# Patient Record
Sex: Female | Born: 1935 | Race: White | Hispanic: No | Marital: Single
Health system: Southern US, Community
[De-identification: ages and names within clinical notes are randomized; demographics above are authoritative.]

---

## 2006-03-04 ENCOUNTER — Ambulatory Visit: Payer: Self-pay | Admitting: Unknown Physician Specialty

## 2012-09-18 ENCOUNTER — Ambulatory Visit: Payer: Self-pay | Admitting: Nurse Practitioner

## 2012-09-25 ENCOUNTER — Inpatient Hospital Stay: Payer: Self-pay | Admitting: Orthopedic Surgery

## 2012-09-25 LAB — CBC
HCT: 41.7 % (ref 35.0–47.0)
HGB: 14 g/dL (ref 12.0–16.0)
MCH: 32 pg (ref 26.0–34.0)
MCV: 95 fL (ref 80–100)
Platelet: 242 10*3/uL (ref 150–440)
RDW: 12.4 % (ref 11.5–14.5)

## 2012-09-25 LAB — COMPREHENSIVE METABOLIC PANEL
Albumin: 3.5 g/dL (ref 3.4–5.0)
Alkaline Phosphatase: 91 U/L (ref 50–136)
Bilirubin,Total: 0.9 mg/dL (ref 0.2–1.0)
Calcium, Total: 9 mg/dL (ref 8.5–10.1)
Co2: 28 mmol/L (ref 21–32)
Creatinine: 0.84 mg/dL (ref 0.60–1.30)
EGFR (African American): >60
EGFR (Non-African Amer.): >60
Osmolality: 277 (ref 275–301)
Potassium: 3.6 mmol/L (ref 3.5–5.1)
SGOT(AST): 67 U/L — ABNORMAL HIGH (ref 15–37)
SGPT (ALT): 28 U/L (ref 12–78)
Sodium: 138 mmol/L (ref 136–145)

## 2012-09-25 LAB — CBC WITH DIFFERENTIAL/PLATELET
Basophil #: 0 10*3/uL (ref 0.0–0.1)
Basophil %: 0.3 %
Eosinophil #: 0 10*3/uL (ref 0.0–0.7)
Eosinophil %: 0.1 %
HGB: 13.4 g/dL (ref 12.0–16.0)
Lymphocyte #: 0.7 10*3/uL — ABNORMAL LOW (ref 1.0–3.6)
MCH: 33.2 pg (ref 26.0–34.0)
MCV: 96 fL (ref 80–100)
Monocyte #: 1.5 x10 3/mm — ABNORMAL HIGH (ref 0.2–0.9)
Neutrophil #: 14 10*3/uL — ABNORMAL HIGH (ref 1.4–6.5)
Neutrophil %: 86 %
RBC: 4.02 10*6/uL (ref 3.80–5.20)
RDW: 12.6 % (ref 11.5–14.5)
WBC: 16.3 10*3/uL — ABNORMAL HIGH (ref 3.6–11.0)

## 2012-09-25 LAB — URINALYSIS, COMPLETE
Bilirubin,UR: NEGATIVE
Glucose,UR: 50 mg/dL (ref 0–75)
Protein: 30
RBC,UR: 3 /HPF (ref 0–5)
Specific Gravity: 1.014 (ref 1.003–1.030)
WBC UR: 1 /HPF (ref 0–5)

## 2012-09-25 LAB — ETHANOL
Ethanol %: <0.003 % (ref 0.000–0.080)
Ethanol: <3 mg/dL

## 2012-09-25 LAB — PROTIME-INR: INR: 1

## 2012-09-25 LAB — CK-MB: CK-MB: 50.7 ng/mL — ABNORMAL HIGH (ref 0.5–3.6)

## 2012-09-25 LAB — APTT: Activated PTT: 27.5 secs (ref 23.6–35.9)

## 2012-09-25 LAB — CK TOTAL AND CKMB (NOT AT ARMC): CK-MB: 25.1 ng/mL — ABNORMAL HIGH (ref 0.5–3.6)

## 2012-09-26 ENCOUNTER — Ambulatory Visit: Payer: Self-pay | Admitting: Orthopedic Surgery

## 2012-09-26 DIAGNOSIS — Z01810 Encounter for preprocedural cardiovascular examination: Secondary | ICD-10-CM

## 2012-09-26 LAB — APTT
Activated PTT: 71.2 secs — ABNORMAL HIGH (ref 23.6–35.9)
Activated PTT: 23 secs — ABNORMAL LOW (ref 23.6–35.9)

## 2012-09-26 LAB — TROPONIN I: Troponin-I: 1.3 ng/mL — ABNORMAL HIGH

## 2012-09-27 DIAGNOSIS — Z01810 Encounter for preprocedural cardiovascular examination: Secondary | ICD-10-CM

## 2012-09-27 LAB — BASIC METABOLIC PANEL WITH GFR
Anion Gap: 4 — ABNORMAL LOW (ref 7–16)
BUN: 16 mg/dL (ref 7–18)
Calcium, Total: 7.8 mg/dL — ABNORMAL LOW (ref 8.5–10.1)
Chloride: 109 mmol/L — ABNORMAL HIGH (ref 98–107)
Co2: 26 mmol/L (ref 21–32)
Creatinine: 0.75 mg/dL (ref 0.60–1.30)
EGFR (African American): >60
EGFR (Non-African Amer.): >60
Glucose: 113 mg/dL — ABNORMAL HIGH (ref 65–99)
Osmolality: 280 (ref 275–301)
Potassium: 3.9 mmol/L (ref 3.5–5.1)
Sodium: 139 mmol/L (ref 136–145)

## 2012-09-27 LAB — CBC WITH DIFFERENTIAL/PLATELET
Basophil %: 0.3 %
Eosinophil #: 0.1 10*3/uL (ref 0.0–0.7)
Eosinophil %: 0.8 %
HCT: 32.3 % — ABNORMAL LOW (ref 35.0–47.0)
Lymphocyte #: 1.3 10*3/uL (ref 1.0–3.6)
MCH: 32.3 pg (ref 26.0–34.0)
MCHC: 33.5 g/dL (ref 32.0–36.0)
MCV: 97 fL (ref 80–100)
Neutrophil #: 8.8 10*3/uL — ABNORMAL HIGH (ref 1.4–6.5)
Platelet: 190 10*3/uL (ref 150–440)
RBC: 3.34 10*6/uL — ABNORMAL LOW (ref 3.80–5.20)
RDW: 12.4 % (ref 11.5–14.5)
WBC: 11.6 10*3/uL — ABNORMAL HIGH (ref 3.6–11.0)

## 2012-09-27 LAB — TROPONIN I: Troponin-I: 0.47 ng/mL — ABNORMAL HIGH

## 2012-09-28 LAB — HEMOGLOBIN: HGB: 9.7 g/dL — ABNORMAL LOW (ref 12.0–16.0)

## 2012-09-29 ENCOUNTER — Encounter: Payer: Self-pay | Admitting: Internal Medicine

## 2012-09-29 LAB — CK: CK, Total: 604 U/L — ABNORMAL HIGH (ref 21–215)

## 2012-09-29 LAB — HEMOGLOBIN: HGB: 9.6 g/dL — ABNORMAL LOW (ref 12.0–16.0)

## 2012-10-01 LAB — CREATININE, SERUM
Creatinine: 0.57 mg/dL — ABNORMAL LOW (ref 0.60–1.30)
EGFR (African American): >60
EGFR (Non-African Amer.): >60

## 2012-10-01 LAB — PATHOLOGY REPORT

## 2012-10-02 LAB — HEMOGLOBIN: HGB: 9.8 g/dL — ABNORMAL LOW (ref 12.0–16.0)

## 2012-10-19 ENCOUNTER — Encounter: Payer: Self-pay | Admitting: Internal Medicine

## 2012-10-19 ENCOUNTER — Ambulatory Visit: Payer: Self-pay | Admitting: Nurse Practitioner

## 2012-11-04 ENCOUNTER — Emergency Department: Payer: Self-pay | Admitting: Emergency Medicine

## 2012-11-05 ENCOUNTER — Emergency Department: Payer: Self-pay | Admitting: Emergency Medicine

## 2012-11-14 ENCOUNTER — Emergency Department: Payer: Self-pay | Admitting: Emergency Medicine

## 2012-11-14 LAB — COMPREHENSIVE METABOLIC PANEL
BUN: 13 mg/dL (ref 7–18)
Calcium, Total: 8.7 mg/dL (ref 8.5–10.1)
Co2: 28 mmol/L (ref 21–32)
EGFR (African American): >60
EGFR (Non-African Amer.): >60
Glucose: 108 mg/dL — ABNORMAL HIGH (ref 65–99)
Osmolality: 280 (ref 275–301)
Potassium: 4.1 mmol/L (ref 3.5–5.1)
SGOT(AST): 19 U/L (ref 15–37)
Total Protein: 6.6 g/dL (ref 6.4–8.2)

## 2012-11-14 LAB — CBC
HCT: 35.4 % (ref 35.0–47.0)
HGB: 11.7 g/dL — ABNORMAL LOW (ref 12.0–16.0)
MCH: 31.3 pg (ref 26.0–34.0)
MCV: 95 fL (ref 80–100)
Platelet: 244 10*3/uL (ref 150–440)
RBC: 3.73 10*6/uL — ABNORMAL LOW (ref 3.80–5.20)
RDW: 13 % (ref 11.5–14.5)

## 2012-11-14 LAB — TROPONIN I: Troponin-I: <0.02 ng/mL

## 2012-11-18 ENCOUNTER — Ambulatory Visit: Payer: Self-pay | Admitting: Nurse Practitioner

## 2012-11-22 ENCOUNTER — Emergency Department: Payer: Self-pay | Admitting: Emergency Medicine

## 2012-12-02 ENCOUNTER — Emergency Department: Payer: Self-pay | Admitting: Emergency Medicine

## 2012-12-04 ENCOUNTER — Inpatient Hospital Stay: Payer: Self-pay | Admitting: Internal Medicine

## 2012-12-04 LAB — URINALYSIS, COMPLETE
Bacteria: NONE SEEN
Blood: NEGATIVE
Nitrite: NEGATIVE
Ph: 6 (ref 4.5–8.0)
RBC,UR: 2 /HPF (ref 0–5)
Specific Gravity: 1.016 (ref 1.003–1.030)
Squamous Epithelial: NONE SEEN
WBC UR: <1 /HPF (ref 0–5)

## 2012-12-04 LAB — CBC
HCT: 34 % — ABNORMAL LOW (ref 35.0–47.0)
HGB: 11.6 g/dL — ABNORMAL LOW (ref 12.0–16.0)
MCHC: 34.3 g/dL (ref 32.0–36.0)
Platelet: 305 10*3/uL (ref 150–440)
RBC: 3.72 10*6/uL — ABNORMAL LOW (ref 3.80–5.20)
RDW: 13.3 % (ref 11.5–14.5)
WBC: 15.6 10*3/uL — ABNORMAL HIGH (ref 3.6–11.0)

## 2012-12-04 LAB — COMPREHENSIVE METABOLIC PANEL
Albumin: 2.6 g/dL — ABNORMAL LOW (ref 3.4–5.0)
BUN: 13 mg/dL (ref 7–18)
Bilirubin,Total: 0.6 mg/dL (ref 0.2–1.0)
Chloride: 100 mmol/L (ref 98–107)
Co2: 25 mmol/L (ref 21–32)
Creatinine: 0.84 mg/dL (ref 0.60–1.30)
EGFR (African American): >60
EGFR (Non-African Amer.): >60
Osmolality: 269 (ref 275–301)
SGOT(AST): 19 U/L (ref 15–37)

## 2012-12-05 LAB — CBC WITH DIFFERENTIAL/PLATELET
Basophil #: 0 10*3/uL (ref 0.0–0.1)
Basophil %: 0.2 %
Eosinophil #: 0 10*3/uL (ref 0.0–0.7)
HCT: 29 % — ABNORMAL LOW (ref 35.0–47.0)
HGB: 10.1 g/dL — ABNORMAL LOW (ref 12.0–16.0)
Lymphocyte %: 9.4 %
MCH: 31.5 pg (ref 26.0–34.0)
MCHC: 34.8 g/dL (ref 32.0–36.0)
MCV: 91 fL (ref 80–100)
Monocyte #: 1.2 x10 3/mm — ABNORMAL HIGH (ref 0.2–0.9)
Monocyte %: 11.7 %
Neutrophil #: 8.3 10*3/uL — ABNORMAL HIGH (ref 1.4–6.5)
Platelet: 253 10*3/uL (ref 150–440)
WBC: 10.5 10*3/uL (ref 3.6–11.0)

## 2012-12-05 LAB — BASIC METABOLIC PANEL
Calcium, Total: 8.5 mg/dL (ref 8.5–10.1)
Chloride: 102 mmol/L (ref 98–107)
Co2: 22 mmol/L (ref 21–32)
EGFR (African American): >60
Glucose: 95 mg/dL (ref 65–99)
Potassium: 3.6 mmol/L (ref 3.5–5.1)
Sodium: 132 mmol/L — ABNORMAL LOW (ref 136–145)

## 2012-12-05 LAB — MAGNESIUM: Magnesium: 1.7 mg/dL — ABNORMAL LOW

## 2012-12-06 LAB — BASIC METABOLIC PANEL
Anion Gap: 10 (ref 7–16)
BUN: 7 mg/dL (ref 7–18)
Calcium, Total: 8.5 mg/dL (ref 8.5–10.1)
Co2: 21 mmol/L (ref 21–32)
Creatinine: 0.5 mg/dL — ABNORMAL LOW (ref 0.60–1.30)
EGFR (African American): >60
Glucose: 101 mg/dL — ABNORMAL HIGH (ref 65–99)
Osmolality: 264 (ref 275–301)
Potassium: 3.5 mmol/L (ref 3.5–5.1)

## 2012-12-10 LAB — CULTURE, BLOOD (SINGLE)

## 2012-12-31 ENCOUNTER — Inpatient Hospital Stay: Payer: Self-pay | Admitting: Internal Medicine

## 2012-12-31 LAB — BASIC METABOLIC PANEL
Anion Gap: 8 (ref 7–16)
Chloride: 108 mmol/L — ABNORMAL HIGH (ref 98–107)
Creatinine: 0.87 mg/dL (ref 0.60–1.30)
EGFR (African American): >60
Glucose: 113 mg/dL — ABNORMAL HIGH (ref 65–99)
Osmolality: 287 (ref 275–301)
Potassium: 3.8 mmol/L (ref 3.5–5.1)

## 2012-12-31 LAB — CBC WITH DIFFERENTIAL/PLATELET
Basophil #: 0 10*3/uL (ref 0.0–0.1)
Basophil %: 0.3 %
Eosinophil #: 0.2 10*3/uL (ref 0.0–0.7)
Eosinophil %: 2.4 %
HCT: 32.7 % — ABNORMAL LOW (ref 35.0–47.0)
Lymphocyte #: 1.6 10*3/uL (ref 1.0–3.6)
MCH: 29.5 pg (ref 26.0–34.0)
Monocyte #: 0.8 x10 3/mm (ref 0.2–0.9)
Neutrophil #: 5.3 10*3/uL (ref 1.4–6.5)
Neutrophil %: 66.9 %
RBC: 3.67 10*6/uL — ABNORMAL LOW (ref 3.80–5.20)
WBC: 7.9 10*3/uL (ref 3.6–11.0)

## 2013-01-01 DIAGNOSIS — I359 Nonrheumatic aortic valve disorder, unspecified: Secondary | ICD-10-CM

## 2013-01-01 LAB — CBC WITH DIFFERENTIAL/PLATELET
Basophil #: 0.1 10*3/uL (ref 0.0–0.1)
Basophil %: 0.6 %
Eosinophil #: 0.1 10*3/uL (ref 0.0–0.7)
Eosinophil %: 0.8 %
HGB: 11.3 g/dL — ABNORMAL LOW (ref 12.0–16.0)
Lymphocyte #: 1.4 10*3/uL (ref 1.0–3.6)
MCH: 29.6 pg (ref 26.0–34.0)
MCHC: 33 g/dL (ref 32.0–36.0)
Monocyte #: 1.2 x10 3/mm — ABNORMAL HIGH (ref 0.2–0.9)
Neutrophil %: 78 %
Platelet: 299 10*3/uL (ref 150–440)
WBC: 12.7 10*3/uL — ABNORMAL HIGH (ref 3.6–11.0)

## 2013-01-01 LAB — CK TOTAL AND CKMB (NOT AT ARMC)
CK, Total: 47 U/L (ref 21–215)
CK-MB: 1.8 ng/mL (ref 0.5–3.6)
CK-MB: 1.6 ng/mL (ref 0.5–3.6)
CK-MB: 1.3 ng/mL (ref 0.5–3.6)

## 2013-01-01 LAB — URINALYSIS, COMPLETE
Bilirubin,UR: NEGATIVE
Blood: NEGATIVE
Squamous Epithelial: 1
WBC UR: 1 /HPF (ref 0–5)

## 2013-01-01 LAB — BASIC METABOLIC PANEL
BUN: 17 mg/dL (ref 7–18)
Chloride: 107 mmol/L (ref 98–107)
Co2: 26 mmol/L (ref 21–32)
Creatinine: 1.02 mg/dL (ref 0.60–1.30)
EGFR (African American): 58 — ABNORMAL LOW
EGFR (Non-African Amer.): 50 — ABNORMAL LOW
Osmolality: 287 (ref 275–301)
Sodium: 142 mmol/L (ref 136–145)

## 2013-01-01 LAB — TROPONIN I: Troponin-I: <0.02 ng/mL

## 2013-01-03 LAB — CREATININE, SERUM
Creatinine: 1.08 mg/dL (ref 0.60–1.30)
EGFR (African American): 54 — ABNORMAL LOW
EGFR (Non-African Amer.): 47 — ABNORMAL LOW

## 2013-01-03 LAB — VANCOMYCIN, TROUGH: Vancomycin, Trough: 12 ug/mL (ref 10–20)

## 2013-01-05 LAB — BASIC METABOLIC PANEL
Anion Gap: 8 (ref 7–16)
BUN: 13 mg/dL (ref 7–18)
Calcium, Total: 8.6 mg/dL (ref 8.5–10.1)
Chloride: 106 mmol/L (ref 98–107)
Co2: 25 mmol/L (ref 21–32)
Creatinine: 0.96 mg/dL (ref 0.60–1.30)
Glucose: 94 mg/dL (ref 65–99)
Osmolality: 277 (ref 275–301)
Potassium: 2.7 mmol/L — ABNORMAL LOW (ref 3.5–5.1)
Sodium: 139 mmol/L (ref 136–145)

## 2013-01-05 LAB — CBC WITH DIFFERENTIAL/PLATELET
Basophil #: 0.1 10*3/uL (ref 0.0–0.1)
Eosinophil #: 0.2 10*3/uL (ref 0.0–0.7)
Eosinophil %: 3.3 %
HCT: 31.6 % — ABNORMAL LOW (ref 35.0–47.0)
HGB: 10.7 g/dL — ABNORMAL LOW (ref 12.0–16.0)
Lymphocyte %: 17.9 %
MCH: 30.1 pg (ref 26.0–34.0)
MCHC: 33.8 g/dL (ref 32.0–36.0)
Monocyte #: 0.9 x10 3/mm (ref 0.2–0.9)
Neutrophil #: 4.9 10*3/uL (ref 1.4–6.5)
Neutrophil %: 66.3 %
RDW: 14.5 % (ref 11.5–14.5)
WBC: 7.5 10*3/uL (ref 3.6–11.0)

## 2013-01-05 LAB — MAGNESIUM: Magnesium: 1.9 mg/dL

## 2013-01-06 LAB — CULTURE, BLOOD (SINGLE)

## 2013-01-23 ENCOUNTER — Emergency Department: Payer: Self-pay | Admitting: Emergency Medicine

## 2013-03-25 ENCOUNTER — Emergency Department: Payer: Self-pay | Admitting: Emergency Medicine

## 2013-07-21 ENCOUNTER — Ambulatory Visit: Payer: Self-pay | Admitting: Nurse Practitioner

## 2013-07-31 ENCOUNTER — Inpatient Hospital Stay: Payer: Self-pay | Admitting: Internal Medicine

## 2013-07-31 LAB — CBC WITH DIFFERENTIAL/PLATELET
BASOS PCT: 0.2 %
Basophil #: 0 10*3/uL (ref 0.0–0.1)
EOS ABS: 0.1 10*3/uL (ref 0.0–0.7)
EOS PCT: 1 %
HCT: 40 % (ref 35.0–47.0)
HGB: 13.7 g/dL (ref 12.0–16.0)
LYMPHS ABS: 0.2 10*3/uL — AB (ref 1.0–3.6)
Lymphocyte %: 1.3 %
MCH: 31.2 pg (ref 26.0–34.0)
MCHC: 34.1 g/dL (ref 32.0–36.0)
MCV: 91 fL (ref 80–100)
Monocyte #: 0.6 x10 3/mm (ref 0.2–0.9)
Monocyte %: 4.5 %
NEUTROS PCT: 93 %
Neutrophil #: 13.5 10*3/uL — ABNORMAL HIGH (ref 1.4–6.5)
Platelet: 204 10*3/uL (ref 150–440)
RBC: 4.38 10*6/uL (ref 3.80–5.20)
RDW: 13 % (ref 11.5–14.5)
WBC: 14.5 10*3/uL — AB (ref 3.6–11.0)

## 2013-07-31 LAB — COMPREHENSIVE METABOLIC PANEL
ALK PHOS: 136 U/L — AB
ALT: 19 U/L (ref 12–78)
AST: 14 U/L — AB (ref 15–37)
Albumin: 3.2 g/dL — ABNORMAL LOW (ref 3.4–5.0)
Anion Gap: 6 — ABNORMAL LOW (ref 7–16)
BUN: 17 mg/dL (ref 7–18)
Bilirubin,Total: 0.5 mg/dL (ref 0.2–1.0)
CHLORIDE: 109 mmol/L — AB (ref 98–107)
CREATININE: 0.84 mg/dL (ref 0.60–1.30)
Calcium, Total: 9.2 mg/dL (ref 8.5–10.1)
Co2: 25 mmol/L (ref 21–32)
GLUCOSE: 152 mg/dL — AB (ref 65–99)
Osmolality: 284 (ref 275–301)
POTASSIUM: 4.2 mmol/L (ref 3.5–5.1)
SODIUM: 140 mmol/L (ref 136–145)
Total Protein: 7.6 g/dL (ref 6.4–8.2)

## 2013-07-31 LAB — URINALYSIS, COMPLETE
Bilirubin,UR: NEGATIVE
GLUCOSE, UR: NEGATIVE mg/dL (ref 0–75)
Ketone: NEGATIVE
Leukocyte Esterase: NEGATIVE
NITRITE: NEGATIVE
Ph: 5 (ref 4.5–8.0)
Protein: NEGATIVE
SPECIFIC GRAVITY: 1.018 (ref 1.003–1.030)
Squamous Epithelial: 1

## 2013-07-31 LAB — TROPONIN I: Troponin-I: <0.02 ng/mL

## 2013-07-31 LAB — LIPASE, BLOOD: Lipase: 127 U/L (ref 73–393)

## 2013-07-31 LAB — CLOSTRIDIUM DIFFICILE(ARMC)

## 2013-08-01 LAB — CBC WITH DIFFERENTIAL/PLATELET
Basophil #: 0 10*3/uL (ref 0.0–0.1)
Basophil %: 0.3 %
EOS PCT: 2 %
Eosinophil #: 0.1 10*3/uL (ref 0.0–0.7)
HCT: 32.1 % — ABNORMAL LOW (ref 35.0–47.0)
HGB: 11.1 g/dL — ABNORMAL LOW (ref 12.0–16.0)
LYMPHS ABS: 0.7 10*3/uL — AB (ref 1.0–3.6)
Lymphocyte %: 11.1 %
MCH: 32 pg (ref 26.0–34.0)
MCHC: 34.7 g/dL (ref 32.0–36.0)
MCV: 92 fL (ref 80–100)
MONO ABS: 0.8 x10 3/mm (ref 0.2–0.9)
Monocyte %: 12.4 %
Neutrophil #: 4.7 10*3/uL (ref 1.4–6.5)
Neutrophil %: 74.2 %
Platelet: 172 10*3/uL (ref 150–440)
RBC: 3.48 10*6/uL — ABNORMAL LOW (ref 3.80–5.20)
RDW: 13.1 % (ref 11.5–14.5)
WBC: 6.3 10*3/uL (ref 3.6–11.0)

## 2013-08-01 LAB — BASIC METABOLIC PANEL
Anion Gap: 7 (ref 7–16)
BUN: 14 mg/dL (ref 7–18)
CO2: 24 mmol/L (ref 21–32)
Calcium, Total: 8.4 mg/dL — ABNORMAL LOW (ref 8.5–10.1)
Chloride: 109 mmol/L — ABNORMAL HIGH (ref 98–107)
Creatinine: 0.76 mg/dL (ref 0.60–1.30)
EGFR (African American): >60
EGFR (Non-African Amer.): >60
GLUCOSE: 96 mg/dL (ref 65–99)
OSMOLALITY: 280 (ref 275–301)
Potassium: 3.4 mmol/L — ABNORMAL LOW (ref 3.5–5.1)
SODIUM: 140 mmol/L (ref 136–145)

## 2013-08-01 LAB — MAGNESIUM: Magnesium: 1.6 mg/dL — ABNORMAL LOW

## 2013-08-02 LAB — CANCER ANTIGEN 19-9: CA 19-9: 10 U/mL (ref 0–35)

## 2013-08-21 ENCOUNTER — Ambulatory Visit: Payer: Self-pay | Admitting: Nurse Practitioner

## 2013-09-18 ENCOUNTER — Ambulatory Visit: Payer: Self-pay | Admitting: Nurse Practitioner

## 2013-10-19 ENCOUNTER — Ambulatory Visit: Payer: Self-pay | Admitting: Nurse Practitioner

## 2013-11-18 ENCOUNTER — Ambulatory Visit: Payer: Self-pay | Admitting: Nurse Practitioner

## 2014-02-14 IMAGING — CT CT HEAD WITHOUT CONTRAST
1 series · 15 of 28 positions shown, 19 images · non-contrast
Comparison: none

REASON FOR EXAM: FALL, SCALP LAC
COMMENTS:   May transport without cardiac monitor

[Series 2: soft tissue · axial · 0.41mm/px · z∈[-153,-28]mm · 15 of 28 slices shown, 19 images]
[im 2/28  brain]
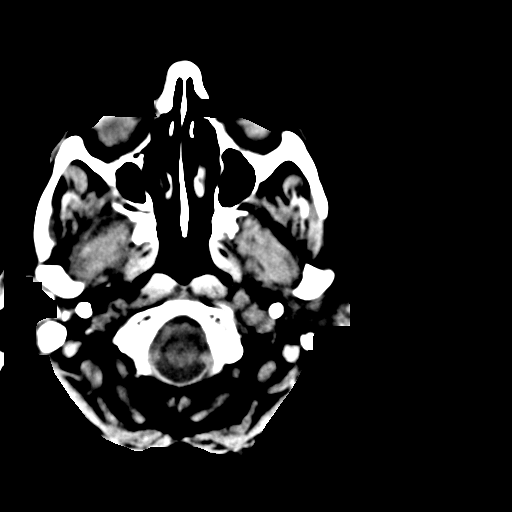
[im 2/28  bone]
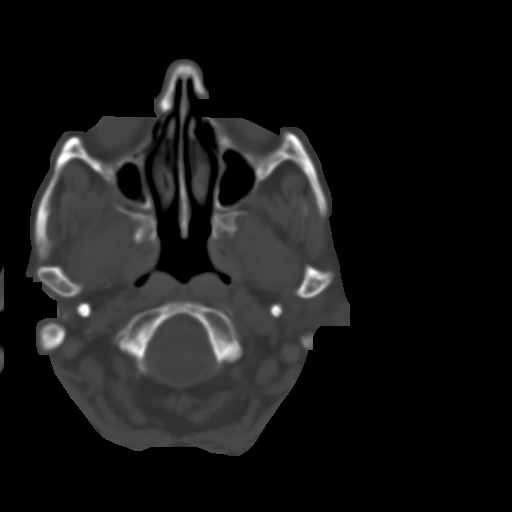
[im 4/28  brain]
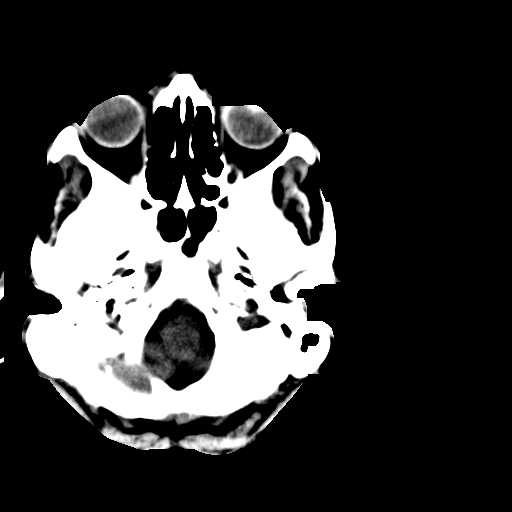
[im 6/28  brain]
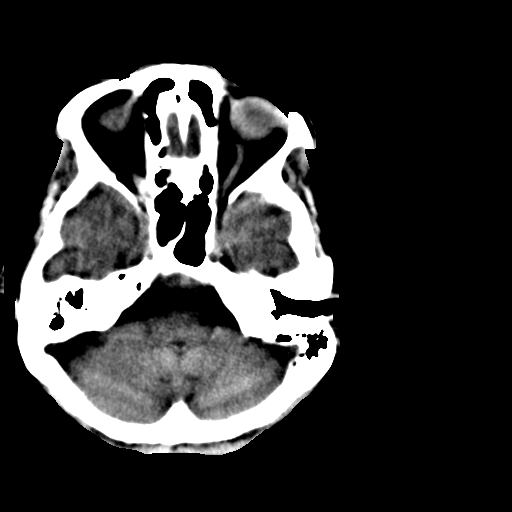
[im 8/28  brain]
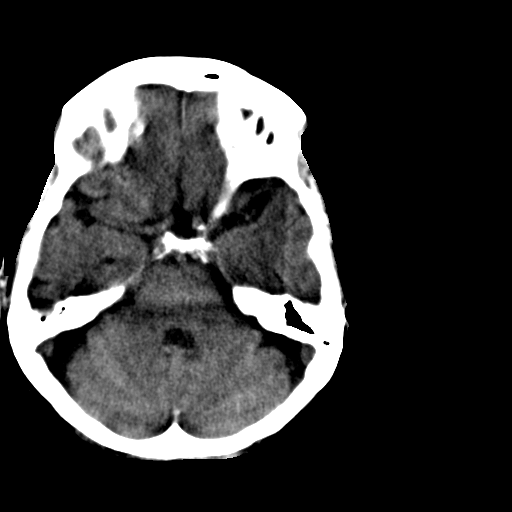
[im 9/28  brain]
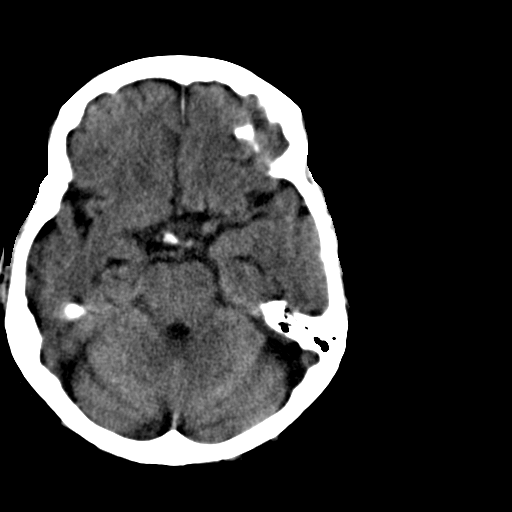
[im 9/28  bone]
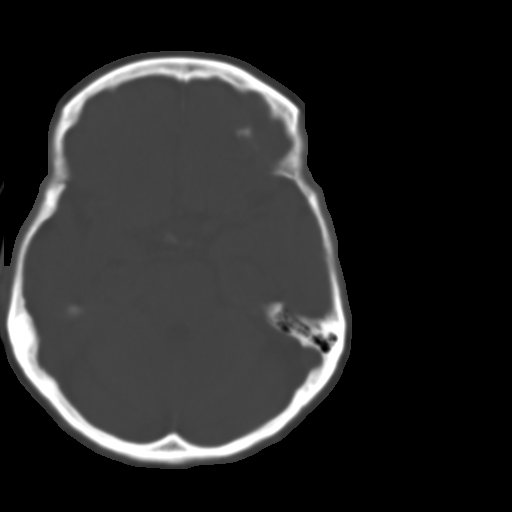
[im 11/28  brain]
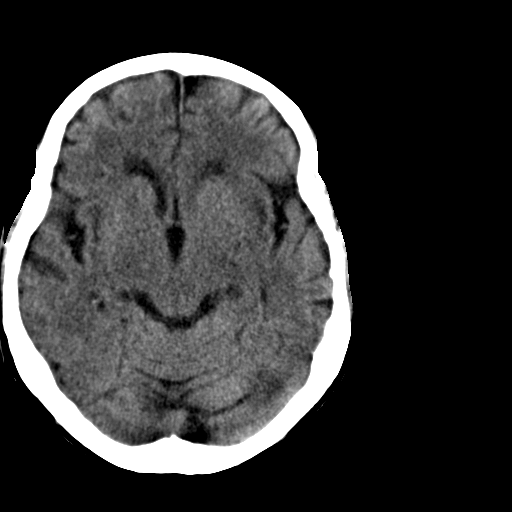
[im 13/28  brain]
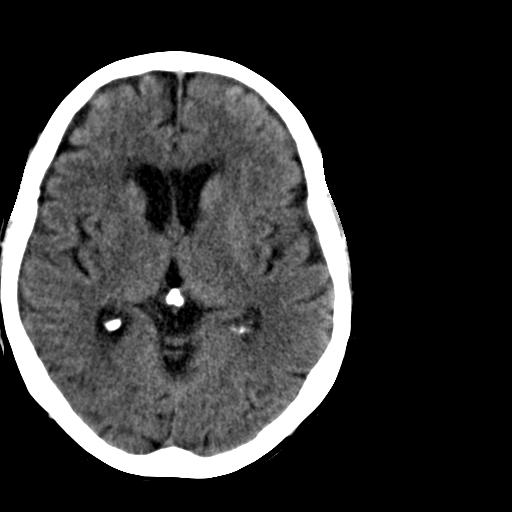
[im 15/28  brain]
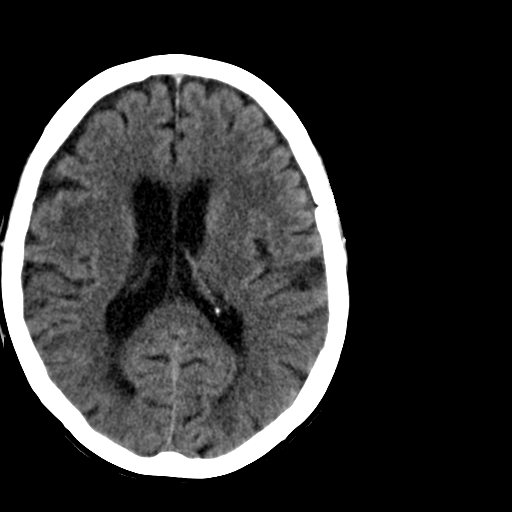
[im 16/28  brain]
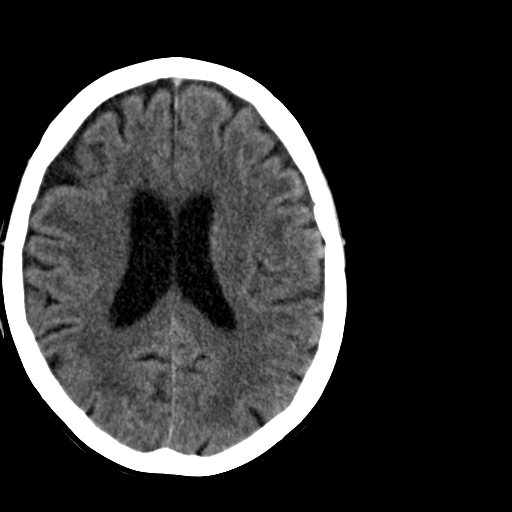
[im 16/28  bone]
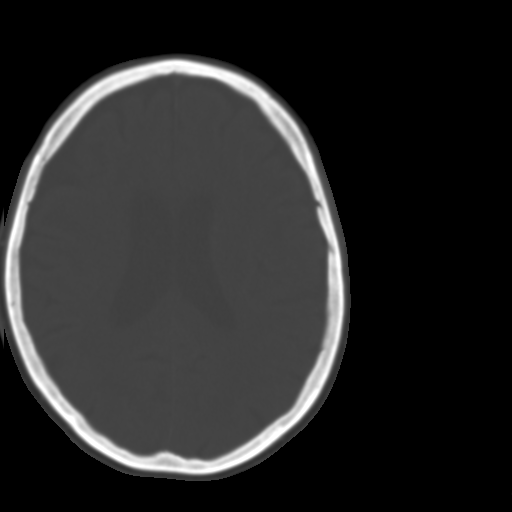
[im 18/28  brain]
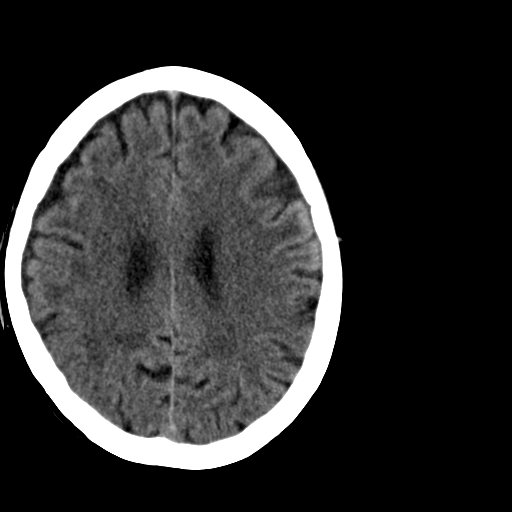
[im 20/28  brain]
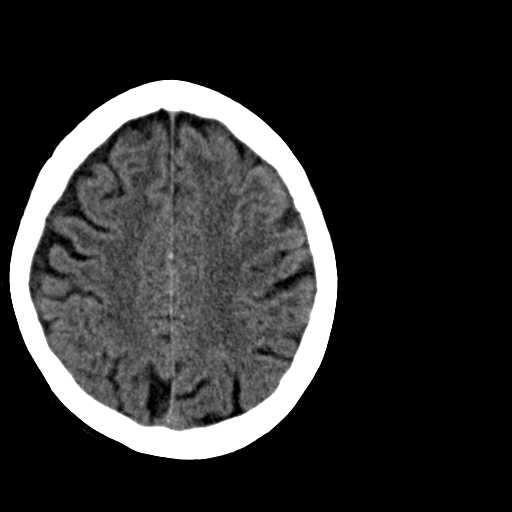
[im 21/28  brain]
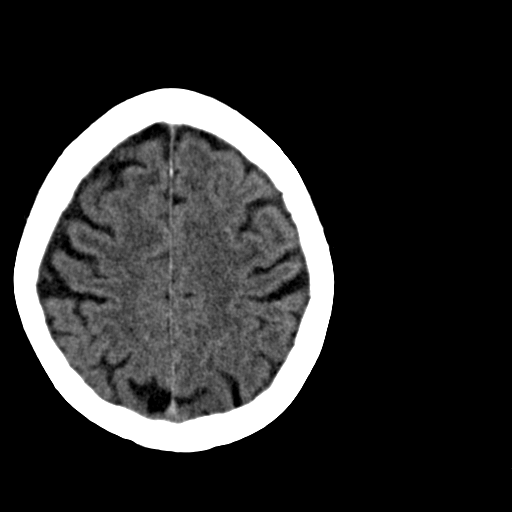
[im 23/28  brain]
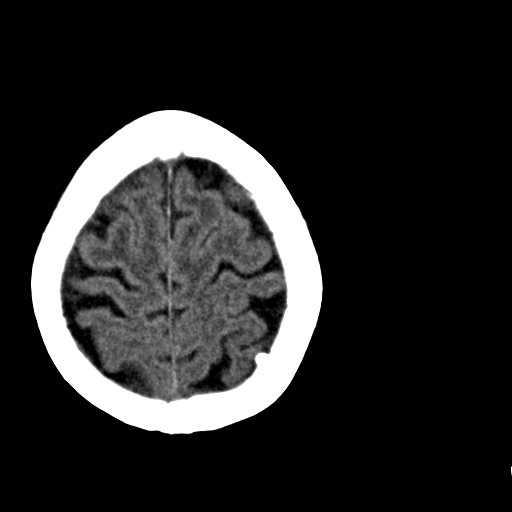
[im 23/28  bone]
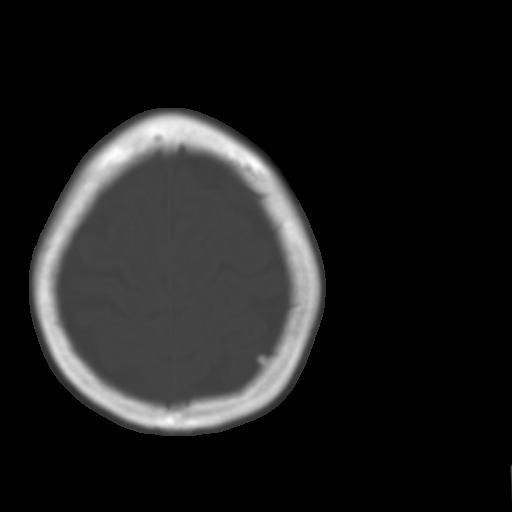
[im 25/28  brain]
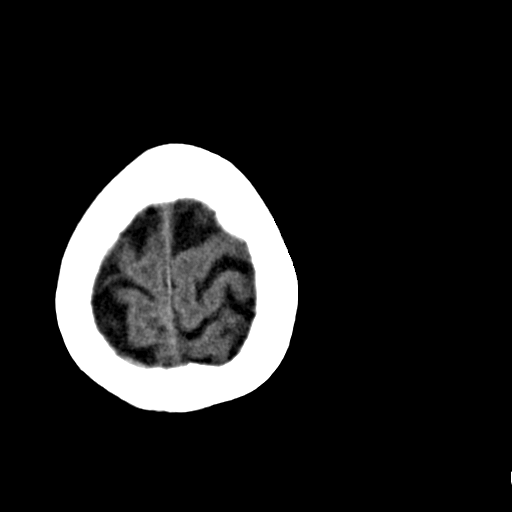
[im 27/28  brain]
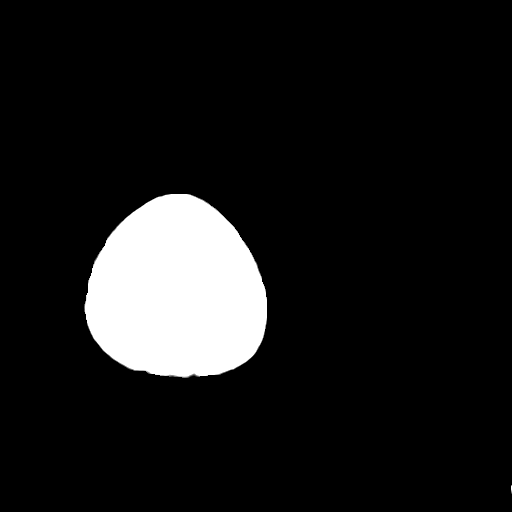

[15 of 28 positions shown; findings below may reference images not displayed]

PROCEDURE:     CT  - CT HEAD WITHOUT CONTRAST  - November 22, 2012 [DATE]

RESULT:     Noncontrast emergent CT of the brain is compared to previous
study dated 14 November, 2012. There is prominence of the ventricles and sulci
consistent with diffuse atrophy. Low-attenuation is present within the
periventricular and subcortical white matter most consistent with chronic
microvascular ischemic disease. There is some sclerosis in the right mastoid
tip as noted previously. The remaining sinuses and mastoid air cells appear
to show normal aeration. No depressed skull fracture is present

Since the previous study and an earlier exam of 04 November, 2012 there appears
to be a small extra-axial fluid collection of the right frontal region. This
causes approximately 3 mm separation not present previously between the
inner table of the calvarium and cortical surface in the area of image 17.
This does not appear to represent an acute subdural fluid collection. MRI
followup and neurosurgical followup would be recommended.
IMPRESSION: 1. Changes of atrophy with chronic microvascular ischemic disease.
2. Probable tiny small subdural fluid collection which appears to be new.
Followup MRI would be recommended if the patient is a candidate. Otherwise
followup CT would be recommended in 24 hours 36 hours.

[REDACTED](*)

## 2014-06-17 IMAGING — CT CT HEAD WITHOUT CONTRAST
2 series · 16 of 30 positions shown, 20 images · non-contrast
Comparison: none

REASON FOR EXAM: fall with forehead trauma
COMMENTS:

PROCEDURE:     CT  - CT HEAD WITHOUT CONTRAST  - March 25, 2013  [DATE]
RESULT:     History: Fall.
Comparison Study: CT 01/01/2013.

[Series 2: without · axial · non-contrast · 0.41mm/px · z∈[-140,-14]mm · 13 of 35 slices shown, 17 images]
[im 3/35  brain]
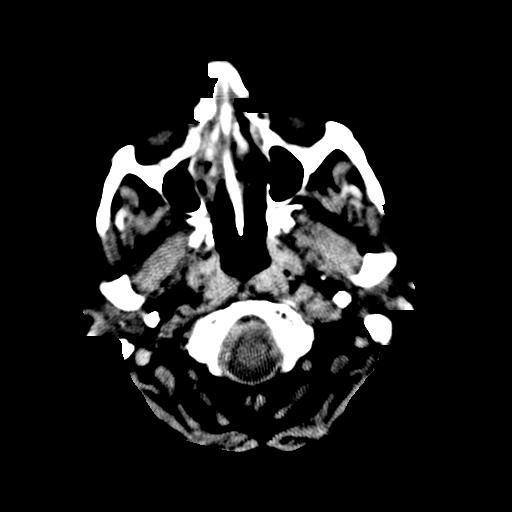
[im 3/35  bone]
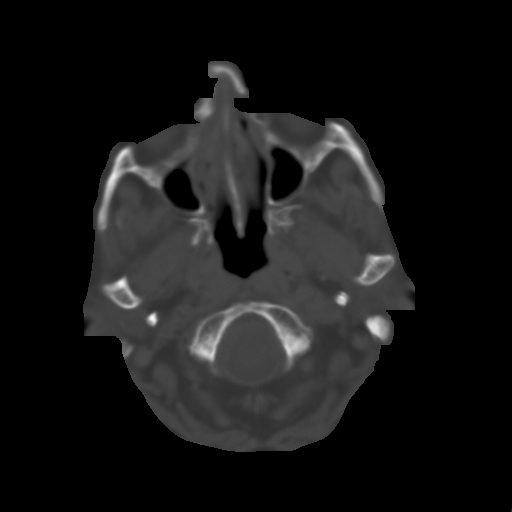
[im 5/35  brain]
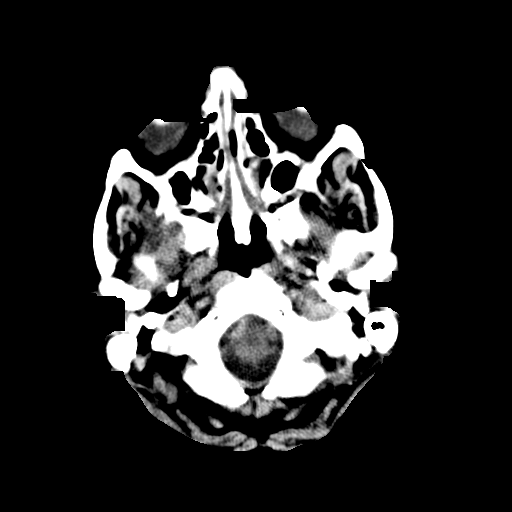
[im 8/35  brain]
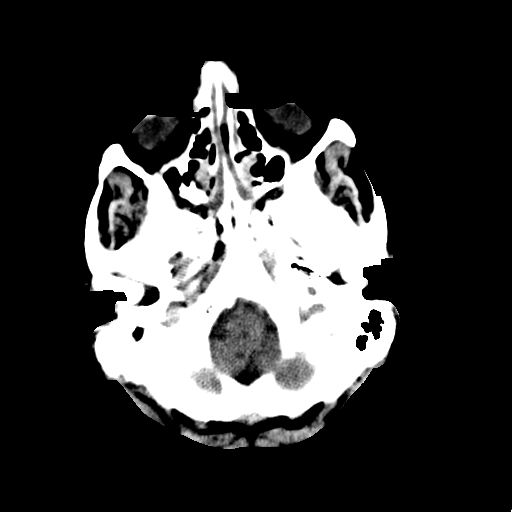
[im 10/35  brain]
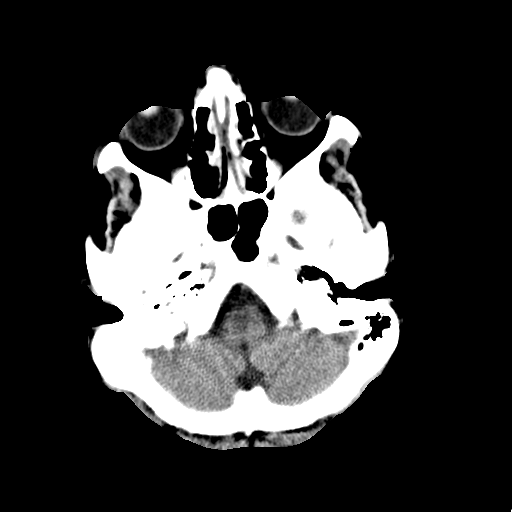
[im 13/35  brain]
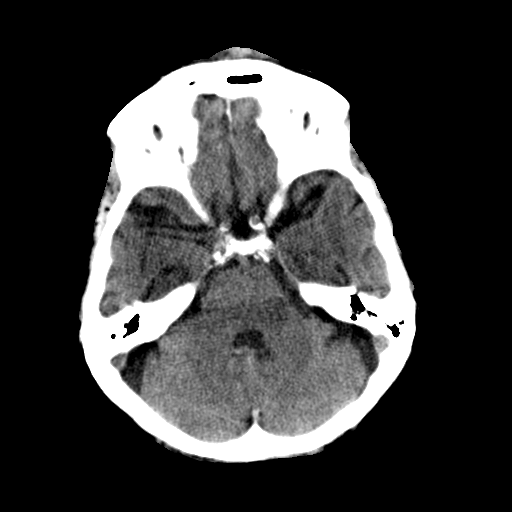
[im 13/35  bone]
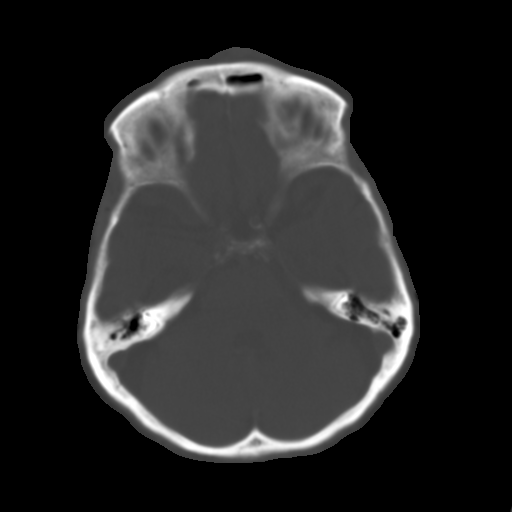
[im 15/35  brain]
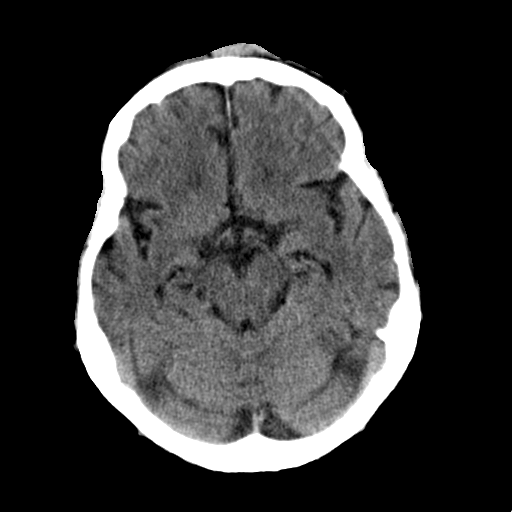
[im 18/35  brain]
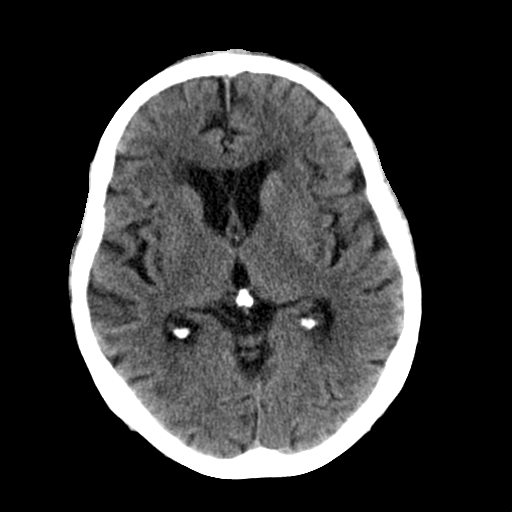
[im 20/35  brain]
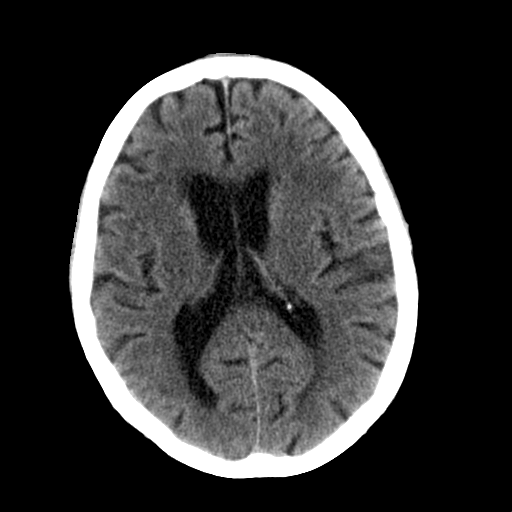
[im 22/35  brain]
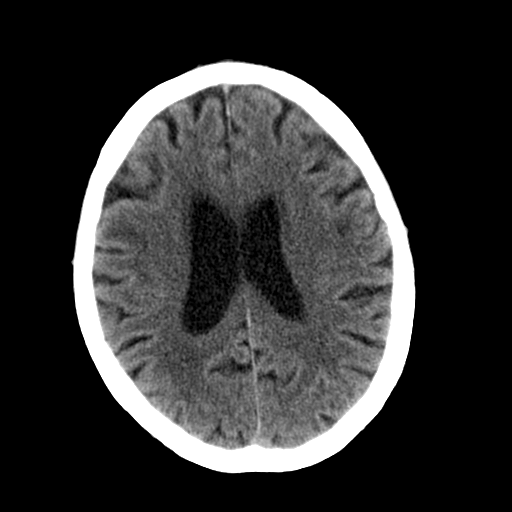
[im 22/35  bone]
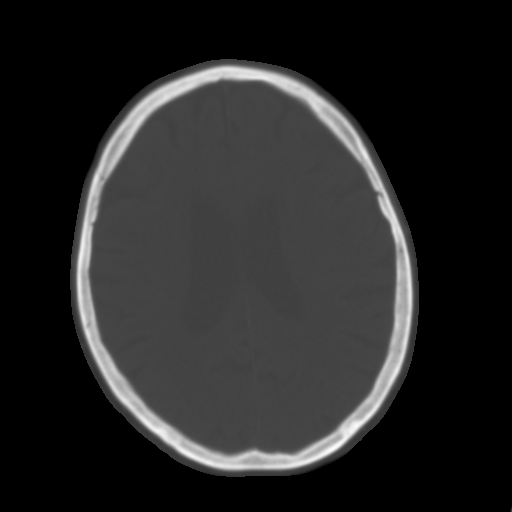
[im 25/35  brain]
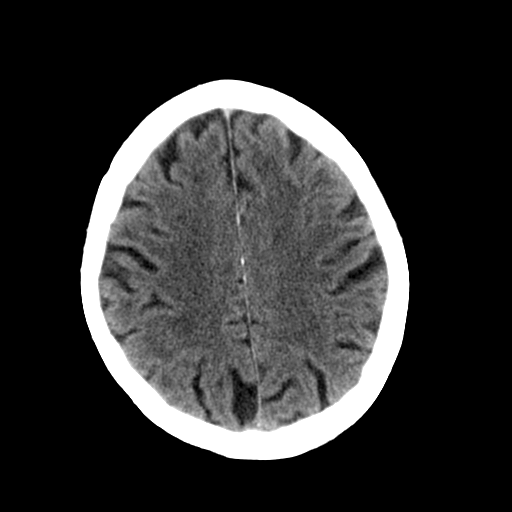
[im 27/35  brain]
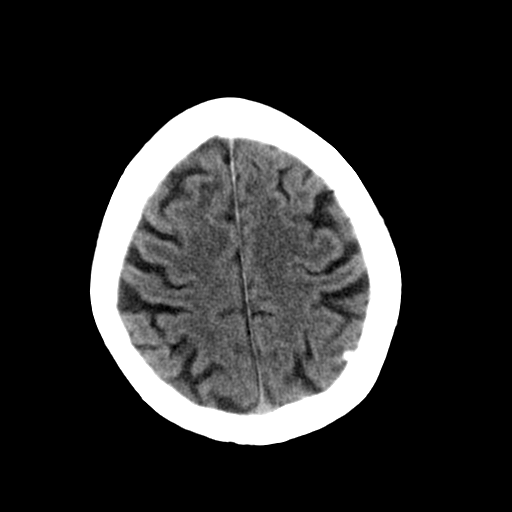
[im 30/35  brain]
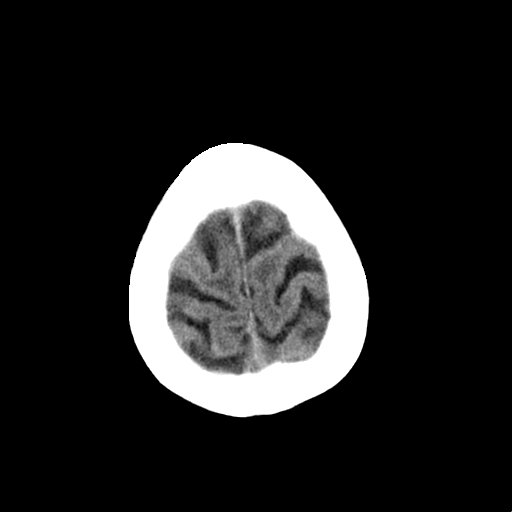
[im 32/35  brain]
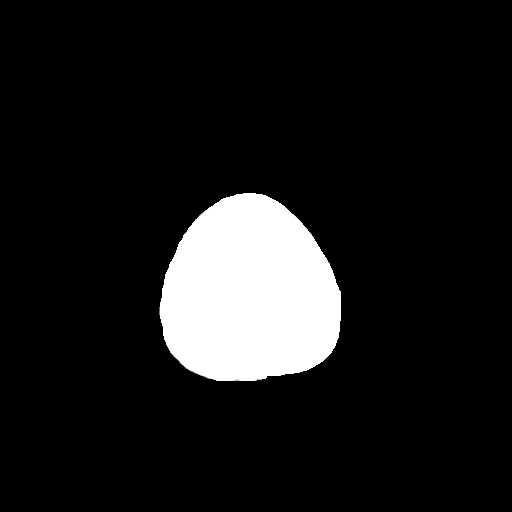
[im 32/35  bone]
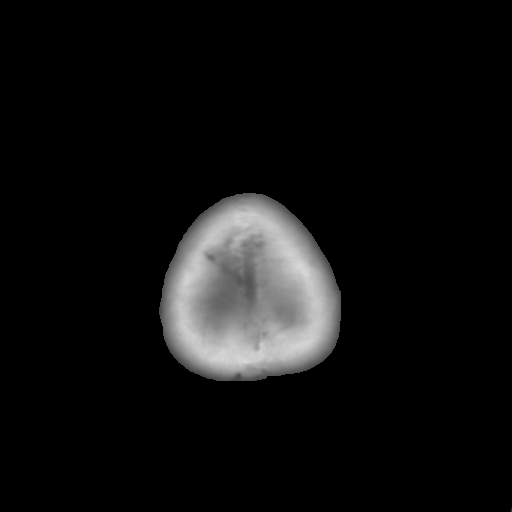

[Series 3: bone · axial · 0.41mm/px · z∈[-140,-110]mm · 3 of 35 slices shown]
[im 3/35  bone]
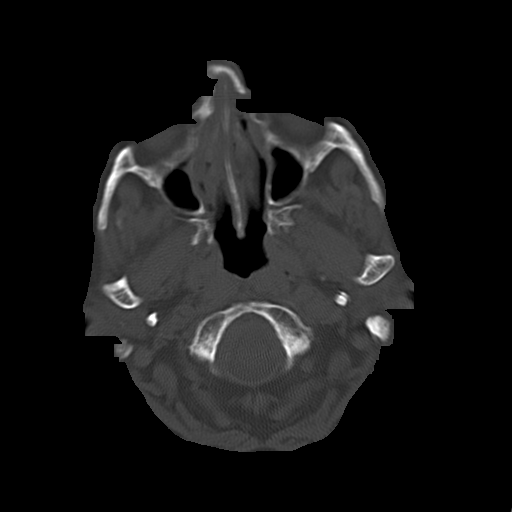
[im 8/35  bone]
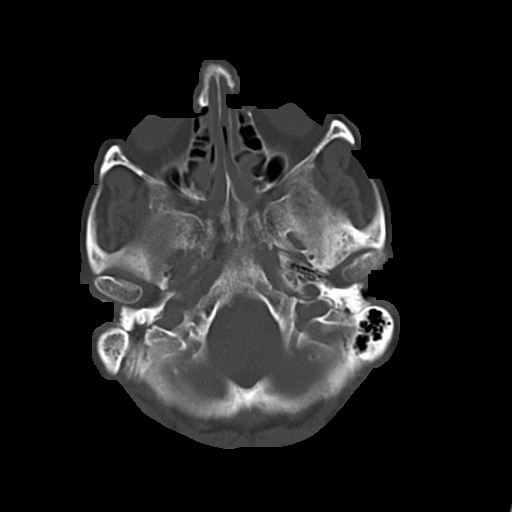
[im 13/35  bone]
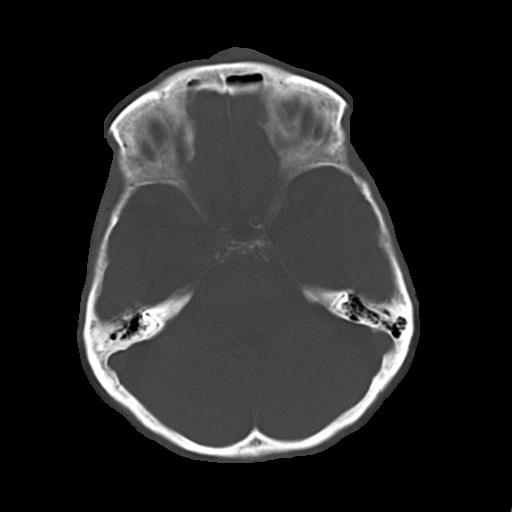

[16 of 30 positions shown; findings below may reference images not displayed]

FINDINGS: Standard nonenhanced CT obtained no mass. No hydrocephalus. No
hemorrhage. Soft tissue hematoma left frontal region. No underlying fracture.
IMPRESSION: Frontal soft tissue hematoma. No underlying fracture or
acute intracranial abnormality.

## 2014-06-20 ENCOUNTER — Ambulatory Visit
Admit: 2014-06-20 | Disposition: A | Discharge status: Home / Self Care | Payer: Self-pay | Attending: Nurse Practitioner | Admitting: Nurse Practitioner

## 2014-07-21 ENCOUNTER — Ambulatory Visit
Admit: 2014-07-21 | Disposition: A | Discharge status: Home / Self Care | Payer: Self-pay | Attending: Nurse Practitioner | Admitting: Nurse Practitioner

## 2014-11-10 NOTE — H&P (Signed)
PATIENT NAME:  Kaitlin Hodges, Kaitlin Hodges MR#:  [id number] DATE OF BIRTH:  06/03/1926  DATE OF ADMISSION:  12/31/2012  ADMITTING PHYSICIAN: Enid Baas, MD   PRIMARY CARE PHYSICIAN: Dr. Maryellen Pile    CHIEF COMPLAINT: Swelling of both legs.   HISTORY OF PRESENT ILLNESS: The patient is an 81 year old Caucasian female with past medical history significant for advanced dementia, with a history of agitation, multiple falls, who is a resident of [facility], was brought in secondary to bilateral lower extremity swelling, erythema with skin breakdown. The patient was recently in the hospital about four weeks ago for a fall and had MRSA cellulitis of her right upper extremity. She was noted to have chronic ulcers and skin changes in the both legs at the time but no edema at the time. The patient is not a great historian at all because of her dementia.  She states that she thinks that her kidney numbers were abnormal so was sent here.   However, on exam, she has bilateral lower extremity erythema and also edema. Her labs look okay and is being admitted for possible cellulitis and rule out congestive heart failure.   PAST MEDICAL HISTORY: 1. Advanced dementia.  2. Multiple falls.  3. MRSA arm cellulitis secondary after a fall.  4. Bilateral lower extremity chronic skin changes from fluid retention.  5. Hypertension.   PAST SURGICAL HISTORY: Right hip fracture repair.   ALLERGIES TO MEDICATIONS: No known drug allergies.  CURRENT MEDICATIONS: 1. Buproban 150 mg p.o. b.i.d.  2. Multivitamin 1 tablet p.o. daily.  3. Ferrous sulfate 325 mg 1 tablet p.o. b.i.d.  4. Imodium 2 mg every 3 hours as needed for diarrhea. Do not exceed 8 doses in 24 hours.  5. Iophen NR 2 teaspoonsful 10 mL every 6 hours as needed for cough.  6. Lorazepam 0.25 mg every 8 hours as needed for agitation.  7. Maalox 30 mL every 6 hours as needed for indigestion heartburn. 8. Tylenol 500 mg every 4 hours as needed for  pain or fever.  9. Metoprolol 25 mg p.o. b.i.d.  10. Milk of magnesia 30 mL daily p.r.n. for constipation.  11. Nicotine patch 7 mg daily transdermal patch.  12. Quetiapine 50 mg at bedtime.  13. Quetiapine 12.5 mg p.o. q.a.m.  14. Senna laxative 1 tablet 8.6 mg p.o. daily.  15. Silvadene 1% topical cream to both legs twice a day.  16. Trazodone 50 mg p.o. daily.  17. Vitamin D3 2000 international units p.o. daily.   SOCIAL HISTORY: She is a resident of [facility].  No smoking currently.   According to the patient, she ambulates with a walker. She is oriented to self at baseline.   FAMILY HISTORY: Not obtainable.  REVIEW OF SYSTEMS Unable to be obtained due to significant dementia.   PHYSICAL EXAMINATION:  VITAL SIGNS: Temperature 98 degrees Fahrenheit, pulse 70, respirations 17, blood pressure 145/60 and pulse oximetry 97% on room air.   GENERAL: Well-built, well-nourished female lying in bed making regular moaning sounds and not in any acute distress.   HEENT: Normocephalic, atraumatic. Pupils equal, round, reacting to light. Anicteric sclerae. Extraocular movements intact. Oropharynx clear without erythema, mass or exudates.   NECK: Supple. No thyromegaly, JVD or carotid bruits. No lymphadenopathy.   LUNGS: She is moving air bilaterally. Decreased bibasilar breath sounds. No wheeze or crackles. No use of accessory muscles for breathing.   CARDIOVASCULAR: S1, S2 regular rate and rhythm. No murmurs, rubs or  gallops.   ABDOMEN: Soft, nontender, nondistended. No hepatosplenomegaly. Normal bowel sounds.   EXTREMITIES: She does have 2+ edema up to the knees, very erythematous.  Chronic skin changes are seen on both feet with some skin breakdown. Feeble dorsalis pedis pulses palpable bilaterally.   SKIN: Other than the erythema described on the legs, no other changes are seen.   NEUROLOGICAL: The patient is unable to comply for a complete neurological  examination. Her face looks symmetric.  No cranial nerve abnormalities seen and she is able to move all four extremities and sensation seems to be intact.   PSYCHOLOGICAL:  The patient is awake, alert, oriented to self.  She knows her name, her age and able to tell me that she was in the hospital, able to maintain a pleasant conversation.   LABORATORY DATA: Sodium 143, potassium 3.8, chloride 108, bicarbonate 27, BUN 16, creatinine 0.87, glucose of 113, calcium 9.3.  WBC 7.9, hemoglobin 10.8, hematocrit 32.1, platelet count is 298. BNP is elevated at 1197. Ultrasound Dopplers bilateral lower extremities showing negative for any deep vein thrombosis. Chest x-ray shows clear lung fields. No acute abnormality noted.  EKG showing normal sinus rhythm, heart rate of 74, no acute ST-T wave abnormalities.   ASSESSMENT AND PLAN: An 81 year old female with history of severe dementia and recent admission 4 weeks ago for methicillin-resistant Staphylococcus aureus cellulitis of arm, brought in from assisted [facility] for bilateral lower extremity swelling and erythema:  1. Bilateral lower extremity edema with erythema, likely cellulitis. Blood cultures have been ordered, started on vancomycin and Zosyn at this time.  Swelling could have been from chronic venous insufficiency; however, with elevated BNP, we will get an echo ordered and start her on Lasix 20 mg daily and monitor on off unit telemetry.  2. Dementia with agitation. Continue her home medications.  3. Hypertension. Continue metoprolol.  4. Gastroesophageal reflux disease. The patient on Protonix.  CODE STATUS: DO NOT RESUSCITATE as she has an out of facility DNR form signed and included in the chart  TOTAL TIME SPENT ON ADMISSION: 50 minutes.    ____________________________ Enid Baasadhika Amar Sippel, MD [id number] D: 12/31/2012 19:46:00 ET T: 12/31/2012 20:09:30 ET JOB#: [id number]  cc: Enid Baasadhika Nelia Rogoff, MD, <Dictator> Enid BaasADHIKA  Selena Swaminathan MD ELECTRONICALLY SIGNED 01/11/2013 15:20

## 2014-11-10 NOTE — Consult Note (Signed)
CHIEF COMPLAINT and HISTORY:  Subjective/Chief Complaint BLE ulcerations   History of Present Illness Patient presents with cellulitis and ulceration of BLE.  She has chronic swelling and discoloration.  Admitted with more pain and infection in the lower legs.   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   Allergic Rhinitis:    consipation:    GERD - Esophageal Reflux:    Hyperlipidemia:    HTN:    Dementia:    Denies medical history:    Hip Replacement:   ALLERGIES:  Allergies:  No Known Allergies:   HOME MEDICATIONS:  Home Medications: Medication Instructions Status  Silvadene 1% topical cream Apply topically to affected area 2 times a day  to feel Active  metoprolol tartrate 25 mg oral tablet 1 tab(s) orally 2 times a day Active  ferrous sulfate 325 mg (65 mg elemental iron) oral tablet 1 tab(s) orally 2 times a day (with meals) Active  nicotine 7 mg/24 hr transdermal film, extended release 1 patch transdermal once a day Active  Buproban 150 mg/12 hours oral tablet, extended release 1 tab(s) orally 2 times a day Active  Cerovite Senior Therapeutic Multiple Vitamins with Minerals oral tablet 1 tab(s) orally once a day Active  QUEtiapine 25 mg oral tablet 0.5 tab (12.$RemoveBef'5mg'fWZlSYrKOz$ ) orally once a day (in the morning). Active  QUEtiapine 25 mg oral tablet 2 tab(s ($Remove'50mg'ORafRJe$ ) orally once a day (in the evening). Active  Senna Lax 8.6 mg oral tablet 1 tab(s) orally once a day Active  traZODone 50 mg oral tablet 1 tab(s) orally once a day (at bedtime) Active  Vitamin D3 2000 intl units oral tablet 1 tab(s) orally once a day Active  Imodium A-D 2 mg oral tablet 1 tab(s) orally every 3 hours with each lose stool as needed for diarrhea. *not to exceed 8 doses in 24 hours* Active  Iophen NR 2 teaspoonsful (10 milliliters) orally every 6 hours as needed for cough. *not to exceed 4 doses* ($RemoveB'100mg'RWbJPZVC$ /53ml) Active  LORazepam 0.5 mg oral  tablet 0.5 tab (0.$RemoveBe'25mg'TkKhqYYbj$ ) orally every 8 hours as needed for agitation. Active  Maalox Advanced Maximum Strength 400 mg-400 mg-40 mg/5 mL oral suspension 2 tablespoonsful (30 milliliters) orally up to 4 times a day as needed for Indigestion, Heartburn. *not to exceed 4 doses in 24 hours* Active  Mapap 500 mg oral tablet 1 tab(s) orally every 4 hours as needed for fever up to 101, headache and or minor discomfort (dose not to exceed $RemoveB'2000mg'KVrVOkbP$ ). *max dose of acetaminophen is $RemoveBeforeDE'3000mg'aVPdPFIFXnIFERR$ /24 hrs from all sources* Active  Milk of Magnesia 8% oral suspension 30 milliliter(s) orally once a day (at bedtime) as needed for constipation. Active   Family and Social History:  Family History Non-Contributory   Social History negative tobacco, negative ETOH   Review of Systems:  Subjective/Chief Complaint LE pain, swelling, infection   Fever/Chills Yes   Cough No   Sputum No   Abdominal Pain No   Diarrhea No   Constipation No   Nausea/Vomiting No   SOB/DOE No   Chest Pain No   Dysuria No   Tolerating PT Yes   Tolerating Diet Yes   Medications/Allergies Reviewed Medications/Allergies reviewed   Physical Exam:  GEN thin, chronically ill appearing   HEENT pink conjunctivae, moist oral mucosa   NECK No masses  trachea midline   RESP normal resp effort  no use of accessory muscles   CARD LE edema present  no JVD  ABD denies tenderness  normal BS   LYMPH negative neck, negative axillae   EXTR negative cyanosis/clubbing, positive edema   SKIN positive ulcers, BLE ulcerations with mild erythema.  Chronic venous stasis changes present   NEURO motor/sensory function intact   PSYCH poor insight   LABS:  Laboratory Results: LabObservation:    14-Jun-14 13:26, Echo Doppler  OBSERVATION   Reason for Test  TDMs:    16-Jun-14 11:36, Vancomycin, Trough LAB  Vancomycin, Trough LAB 12  Result(s) reported on 03 Jan 2013 at 12:01PM.  Routine Micro:    13-Jun-14 19:30, Blood Culture   Micro Text Report   BLOOD CULTURE    COMMENT                   NO GROWTH IN 36 HOURS     ANTIBIOTIC  Culture Comment   NO GROWTH IN 36 HOURS   Result(s) reported on 02 Jan 2013 at 06:48AM.    13-Jun-14 20:00, Blood Culture  Micro Text Report   BLOOD CULTURE    COMMENT                   NO GROWTH IN 36 HOURS     ANTIBIOTIC  Culture Comment   NO GROWTH IN 36 HOURS   Result(s) reported on 02 Jan 2013 at 06:48AM.  Cardiology:    13-Jun-14 18:16, ED ECG  Ventricular Rate 74  Atrial Rate 74  P-R Interval 152  QRS Duration 70  QT 384  QTc 426  P Axis 27  R Axis 0  T Axis 72  ECG interpretation   Sinus rhythm with Premature supraventricular complexes  Otherwise normal ECG  When compared with ECG of 04-Dec-2012 12:22,  Premature supraventricular complexes are now Present  ----------unconfirmed----------  Confirmed by OVERREAD, NOT ([id number]), editor PEARSON, BARBARA ([id number]) on 01/03/2013 8:44:39 AM  ED ECG     14-Jun-14 13:26, Echo Doppler  Echo Doppler   REASON FOR EXAM:      COMMENTS:       PROCEDURE: [facility] - ECHO DOPPLER COMPLETE(TRANSTHOR)  - Jan 01 2013  1:26PM     RESULT: Echocardiogram Report    Patient Name:   Kaitlin Hodges Date of Exam: 01/01/2013  Medical Rec #:  [id number]               Custom1:  Date of Birth:  [birth year]             Height:  Patient Age:    81 years             Weight:       117.0 lb  Patient Gender: F                    BSA:          1.41 m??    Indications: CHF  Sonographer:    Janalee Dane RCS  Referring Phys: Gladstone Lighter    Summary:   1. Left ventricular ejection fraction, by visual estimation, is 60 to   65%.   2. Normal global left ventricular systolic function.   3. Impaired relaxation pattern of LV diastolic filling.   4. Mild concentric left ventricular hypertrophy.   5. Mild aortic regurgitation.   6. Mild to moderate aortic valve sclerosis/calcification without any   evidence of aortic stenosis.   7. Mild tricuspid  regurgitation.   8. No evidence of pulmonary hypertension.  2D AND M-MODE MEASUREMENTS (normal ranges within parentheses):  Left Ventricle:          Normal  IVSd (2D):      1.14 cm (0.7-1.1)  LVPWd (2D):     0.97 cm (0.7-1.1) Aorta/LA:                  Normal  LVIDd (2D):     4.11 cm (3.4-5.7) Aortic Root (2D): 3.40 cm (2.4-3.7)  LVIDs (2D):     2.28 cm           Left Atrium (2D): 2.90 cm (1.9-4.0)  LV FS (2D):     44.5 %   (>25%)  LV EF (2D):     76.3 %   (>50%)                                    Right Ventricle:                                    RVd (2D):  LV DIASTOLIC FUNCTION:  MV Peak E: 0.68 m/s E/e' Ratio: 11.70  MV Peak A: 0.90 m/s Decel Time: 243 msec  E/A Ratio: 0.76  SPECTRAL DOPPLER ANALYSIS (where applicable):  Mitral Valve:  MV P1/2 Time: 70.47 msec  MV Area, PHT: 3.12 cm??  Aortic Insufficiency:  AI Half-time:  459 msec  AI Decel Rate: 2.16 m/s??  Tricuspid Valve and PA/RV Systolic Pressure: TR Max Velocity: 2.41 m/s RA   Pressure: 5 mmHg RVSP/PASP: 28.2 mmHg  Pulmonic Insufficiency:  PI EDV: 0.64 m/s PADP: 6.6 mmHg    PHYSICIAN INTERPRETATION:  Left Ventricle: The left ventricular internal cavity size was normal.   Mild concentric left ventricular hypertrophy. Global LV systolic function   was normal. Left ventricular ejection fraction, by visual estimation, is   60 to 65%. Spectral Doppler shows impaired relaxation pattern of LV   diastolic filling.  Right Ventricle: Normal right ventricular size, wall thickness, and   systolic function.  Left Atrium: The left atrium is normal in size and structure.  Right Atrium: The right atrium is normal in size and structure.  Pericardium: There is no evidence of pericardial effusion.  Mitral Valve: The mitral valve is normal in structure. No evidence of   mitral valve stenosis. Trace mitral valve regurgitation is seen.  Tricuspid Valve: The tricuspidvalve is normal. Mild tricuspid   regurgitation is visualized. The  tricuspid regurgitant velocity is 2.41   m/s, and with an assumed right atrial pressure of 5 mmHg, the estimated   right ventricular systolic pressure is normal at 28.2 mmHg.  Aortic Valve: Mild to moderate aortic valve sclerosis/calcification is   present, without any evidence of aortic stenosis. Mild aortic valve   regurgitation is seen.  Pulmonic Valve: The pulmonic valve is normal. Mild pulmonic valve     regurgitation.  Aorta: The aortic root is normal in size and structure.  Venous: The inferior vena cava was normal. The inferior vena cava was   normal sized.    [id number] Kathlyn Sacramento MD  Electronically signed by [id number] Kathlyn Sacramento MD  Signature Date/Time: 01/01/2013/3:27:12 PM    *** Final ***    IMPRESSION: .        Verified By: Mertie Clause. ARIDA, M.D., MD  Routine Chem:    13-Jun-14 03:50, Basic Metabolic Panel (w/Total Calcium)  Glucose, Serum 113  BUN 16  Creatinine (  comp) 0.87  Sodium, Serum 143  Potassium, Serum 3.8  Chloride, Serum 108  CO2, Serum 27  Calcium (Total), Serum 9.3  Anion Gap 8  Osmolality (calc) 287  eGFR (African American) >60  eGFR (Non-African American) >60  eGFR values <71mL/min/1.73 m2 may be an indication of chronic  kidney disease (CKD).  Calculated eGFR is useful in patients with stable renal function.  The eGFR calculation will not be reliable in acutely ill patients  when serum creatinine is changing rapidly. It is not useful in   patients on dialysis. The eGFR calculation may not be applicable  to patients at the low and high extremes of body sizes, pregnant  women, and vegetarians.    13-Jun-14 16:44, B-Type Natriuretic Peptide ([facility])  B-Type Natriuretic Peptide [facility]) [id number]  Result(s) reported on 31 Dec 2012 at 05:12PM.    14-Jun-14 60:60, Basic Metabolic Panel (w/Total Calcium)  Glucose, Serum 137  BUN 17  Creatinine (comp) 1.02  Sodium, Serum 142  Potassium, Serum 3.4  Chloride, Serum 107  CO2, Serum 26  Calcium  (Total), Serum 9.0  Anion Gap 9  Osmolality (calc) 287  eGFR (African American) 58  eGFR (Non-African American) 50  eGFR values <40mL/min/1.73 m2 may be an indication of chronic  kidney disease (CKD).  Calculated eGFR is useful in patients with stable renal function.  The eGFR calculation will not be reliable in acutely ill patients  when serum creatinine is changing rapidly. It is not useful in   patients on dialysis. The eGFR calculation may not be applicable  to patients at the low and high extremes of body sizes, pregnant  women, and vegetarians.    16-Jun-14 04:55, Creatinine Serum  Creatinine (comp) 1.08  eGFR (African American) 54  eGFR (Non-African American) 47  eGFR values <11mL/min/1.73 m2 may be an indication of chronic  kidney disease (CKD).  Calculated eGFR is useful in patients with stable renal function.  The eGFR calculation will not be reliable in acutely ill patients  when serum creatinine is changing rapidly. It is not useful in   patients on dialysis. The eGFR calculation may not be applicable  to patients at the low and high extremes of body sizes, pregnant  women, and vegetarians.  Cardiac:    14-Jun-14 00:14, Cardiac Panel  CK, Total 62  CPK-MB, Serum 1.8  Result(s) reported on 01 Jan 2013 at 01:00AM.    14-Jun-14 00:14, Troponin I  Troponin I < 0.02  0.00-0.05  0.05 ng/mL or less: NEGATIVE   Repeat testing in 3-6 hrs   if clinically indicated.  >0.05 ng/mL: POTENTIAL   MYOCARDIAL INJURY. Repeat   testing in 3-6 hrs if   clinically indicated.  NOTE: An increase or decrease   of 30% or more on serial   testing suggests a   clinically important change    14-Jun-14 07:40, Cardiac Panel  CK, Total 52  CPK-MB, Serum 1.6  Result(s) reported on 01 Jan 2013 at 08:12AM.    14-Jun-14 07:40, Troponin I  Troponin I < 0.02  0.00-0.05  0.05 ng/mL or less: NEGATIVE   Repeat testing in 3-6 hrs   if clinically indicated.  >0.05 ng/mL: POTENTIAL    MYOCARDIAL INJURY. Repeat   testing in 3-6 hrs if   clinically indicated.  NOTE: An increase or decrease   of 30% or more on serial   testing suggests a   clinically important change    14-Jun-14 15:46, Cardiac Panel  CK, Total 47  CPK-MB,  Serum 1.3  Result(s) reported on 01 Jan 2013 at 04:20PM.    14-Jun-14 15:46, Troponin I  Troponin I < 0.02  0.00-0.05  0.05 ng/mL or less: NEGATIVE   Repeat testing in 3-6 hrs   if clinically indicated.  >0.05 ng/mL: POTENTIAL   MYOCARDIAL INJURY. Repeat   testing in 3-6 hrs if   clinically indicated.  NOTE: An increase or decrease   of 30% or more on serial   testing suggests a   clinically important change  Routine UA:    14-Jun-14 18:29, Urinalysis  Color (UA) Straw  Clarity (UA) Clear  Glucose (UA) Negative  Bilirubin (UA) Negative  Ketones (UA) Negative  Specific Gravity (UA) 1.009  Blood (UA) Negative  pH (UA) 6.0  Protein (UA) Negative  Nitrite (UA) Negative  Leukocyte Esterase (UA) Negative  Result(s) reported on 01 Jan 2013 at 06:41PM.  RBC (UA) 2 /HPF  WBC (UA) 1 /HPF  Bacteria (UA)   NONE SEEN  Epithelial Cells (UA) 1 /HPF  Mucous (UA) PRESENT  Result(s) reported on 01 Jan 2013 at 06:41PM.  Routine Hem:    13-Jun-14 16:44, CBC Profile  WBC (CBC) 7.9  RBC (CBC) 3.67  Hemoglobin (CBC) 10.8  Hematocrit (CBC) 32.7  Platelet Count (CBC) 298  MCV 89  MCH 29.5  MCHC 33.1  RDW 14.8  Neutrophil % 66.9  Lymphocyte % 20.1  Monocyte % 10.3  Eosinophil % 2.4  Basophil % 0.3  Neutrophil # 5.3  Lymphocyte # 1.6  Monocyte # 0.8  Eosinophil # 0.2  Basophil # 0.0  Result(s) reported on 31 Dec 2012 at 05:03PM.    14-Jun-14 00:14, CBC Profile  WBC (CBC) 12.7  RBC (CBC) 3.83  Hemoglobin (CBC) 11.3  Hematocrit (CBC) 34.4  Platelet Count (CBC) 299  MCV 90  MCH 29.6  MCHC 33.0  RDW 14.4  Neutrophil % 78.0  Lymphocyte % 11.2  Monocyte % 9.4  Eosinophil % 0.8  Basophil % 0.6  Neutrophil # 9.9  Lymphocyte # 1.4   Monocyte # 1.2  Eosinophil # 0.1  Basophil # 0.1  Result(s) reported on 01 Jan 2013 at 01:04AM.    16-Jun-14 04:55, WBC  WBC (CBC) 7.4  Result(s) reported on 03 Jan 2013 at 06:09AM.   RADIOLOGY:  Radiology Results: XRay:    08-Mar-14 14:30, Chest PA and Lateral  Chest PA and Lateral  REASON FOR EXAM:    sob  COMMENTS:       PROCEDURE: DXR - DXR CHEST PA (OR AP) AND LATERAL  - Sep 25 2012  2:30PM     RESULT: There is no previous exam for comparison.    The interstitial markings are mildly prominent, likely fibrotic. The   heart isborderline enlarged. The lungs are hyperinflated consistent with   COPD. No pneumothorax is evident. The ribs appear to be intact on this   single projection.    IMPRESSION:   1. COPD. Fibrotic changes. No definite acute abnormality evident.  Dictation Site: 6        Verified By: Sundra Aland, M.D., MD    08-Mar-14 14:30, Elbow Left Complete  Elbow Left Complete  REASON FOR EXAM:    pain l elbow  COMMENTS:       PROCEDURE: DXR - DXR ELBOW LT COMP W/OBLIQUES  - Sep 25 2012  2:30PM     RESULT: Left elbow images demonstrate significant artifact from overlying   loose scan. One area concern is over the medial  supracondylar region. A   definite joint effusion is not appreciated on the lateral view. A   definite fractures not appreciated but given the significant artifact   followup images would be recommended if the patient has persistent or   worsening symptoms.    IMPRESSION:  The study is limited by significant artifact as discussed   above. No definite fracture is appreciated on all images. Follow up   imaging may be necessary.  Dictation Site: 6        Verified By: Sundra Aland, M.D., MD    08-Mar-14 14:30, Hip Right Complete  Hip Right Complete  REASON FOR EXAM:    pain after fall  COMMENTS:       PROCEDURE: DXR - DXR HIP RIGHT COMPLETE  - Sep 25 2012  2:30PM     RESULT: Right hip images demonstrate a right femoral  neck fracture with   superior migration of the shaft only slightly. The femoral head appears   intact. The acetabulum and pubic region appears unremarkable.    IMPRESSION:   1. Right femoral neck fracture.    Dictation Site: 6      Verified By: Sundra Aland, M.D., MD    08-Mar-14 14:30, Humerus Right  Humerus Right  REASON FOR EXAM:    r arm pain  COMMENTS:       PROCEDURE: DXR - DXR HUMERUS RIGHT  - Sep 25 2012  2:30PM     RESULT: Right humerus images are somewhat underexposed. No gross bony   abnormality is evident but if there is concern for fracture in the   humeral head or elbow then dedicated images of the shoulder or elbow are   recommended.    IMPRESSION:  Please see above.    Dictation Site: 6      Verified By: Sundra Aland, M.D., MD    08-Mar-14 14:30, Pelvis AP Only  Pelvis AP Only  REASON FOR EXAM:    pain r hip  COMMENTS:       PROCEDURE: DXR - DXR PELVIS AP ONLY  - Sep 25 2012  2:30PM     RESULT: Degenerative changes are seen diffusely in the lumbar spine.   Orthopedic screws are present through the left femoral neck into the  femoral head. There is a fracture in the right femoral neck. No   significant displacement is seen on this single view.    IMPRESSION:  Please see above.    Dictation Site: 6      Verified By: Sundra Aland, M.D., MD    09-Mar-14 21:10, Pelvis AP Only  Pelvis AP Only  REASON FOR EXAM:    post-op low AP  COMMENTS:   Bedside (portable):Y    PROCEDURE: DXR - DXR PELVIS AP ONLY  - Sep 26 2012  9:10PM     RESULT: The single view the lower pelvis shows a patient status post   right hip hemiarthroplasty. Skin staples arepresent. Orthopedic screws   are present in the proximal left femur. There is no immediate   postoperative bone or hardware complication.    IMPRESSION:  Please see above.    Dictation Site: 1      Verified By: Sundra Aland, M.D., MD    [phone] 23:34, Elbow Right Complete  Elbow  Right Complete  REASON FOR EXAM:    FALL, SKIN TEAR  COMMENTS:   May transport without cardiac monitor    PROCEDURE: DXR - DXR  ELBOW RT COMP W/OBLIQUES  - Nov 22 2012 11:34PM     RESULT: Right elbow images demonstrate no definite fracture, dislocation   or radiopaque foreign body. Degenerative changes are present.    IMPRESSION:  Please see above. If an occult fractures are concern then   followup imaging would be suggested in 7 to 10 days.    Dictation Site: 6        Verified By: Sundra Aland, M.D., MD    17-May-14 13:05, Elbow Right Complete  Elbow Right Complete  REASON FOR EXAM:    elbow pain  COMMENTS:       PROCEDURE: DXR - DXR ELBOW RT COMP W/OBLIQUES  - Dec 04 2012  1:05PM     RESULT: Four views of the right elbow are submitted. There is diffuse   soft tissueswelling. There is no soft tissue gas. There is a small   olecranon spur. There is no joint effusion. There is no evidence of a   bony fracture.    IMPRESSION:  There is soft tissue swelling over the right elbow but no   underlying acute bony abnormality is demonstrated.     Dictation Site: 5    Verified By: DAVID A. Martinique, M.D., MD    13-Jun-14 18:02, Chest PA and Lateral  Chest PA and Lateral  REASON FOR EXAM:    new onset chf  COMMENTS:       PROCEDURE: DXR - DXR CHEST PA (OR AP) AND LATERAL  - Dec 31 2012  6:02PM     RESULT: Comparison made to prior study 09/25/2012. Lungs clear , heart size   normal.    IMPRESSION:  No acute abnormality.        Verified By: Osa Craver, M.D., MD  [place]:    13-Jun-14 17:15, [place] Color Flow Doppler Low Extrem Bilat (Legs)  [place] Color Flow Doppler Low Extrem Bilat (Legs)  REASON FOR EXAM:    lower extremity swellling bilaterally  COMMENTS:       PROCEDURE: [place]  - US DOPPLER LOW EXTR BILATERAL  - Dec 31 2012  5:15PM     RESULT: Bilateral extremity color flow duplex Doppler is negative. No   deep venous thrombosis.    IMPRESSION:  Negative  exam.        Verified By: Osa Craver, M.D., MD  LabUnknown:    08-Mar-14 14:30, Chest PA and Lateral  PACS Image    08-Mar-14 14:30, Elbow Left Complete  PACS Image    08-Mar-14 14:30, Hip Right Complete  PACS Image    08-Mar-14 14:30, Humerus Right  PACS Image    08-Mar-14 14:30, Pelvis AP Only  PACS Image    08-Mar-14 17:25, CT Head Without Contrast  PACS Image    09-Mar-14 21:10, Pelvis AP Only  PACS Image    17-Apr-14 22:47, CT Head Without Contrast  PACS Image    27-Apr-14 02:35, CT Head Without Contrast  PACS Image    05-May-14 23:29, CT Head Without Contrast  PACS Image    05-May-14 23:34, Elbow Right Complete  PACS Image    15-May-14 01:54, CT Head Without Contrast  PACS Image    17-May-14 12:50, Wound Aerobic Culture  Ind. Clindamycin Resistance  Ind. Clindamycin Resistance    17-May-14 13:05, Elbow Right Complete  PACS Image    13-Jun-14 17:15, [place] Color Flow Doppler Low Extrem Bilat (Legs)  PACS Image    13-Jun-14 18:02, Chest PA and Lateral  PACS Image  13-Jun-14 21:25, CT Head Without Contrast  PACS Image  CT:    08-Mar-14 17:25, CT Head Without Contrast  CT Head Without Contrast  REASON FOR EXAM:    ha  COMMENTS:       PROCEDURE: CT  - CT HEAD WITHOUT CONTRAST  - Sep 25 2012  5:25PM     RESULT: Emergent noncontrast CT of the brain is performed. The patient   has no previous exam for comparison.    There is mild prominence of the ventricles and sulci consistent with   atrophy within normal limits for the patient's age. There is no   intracranial hemorrhage, mass, mass effect or midline shift. No   territorial infarct is appreciated. There is mucosal thickening in the   left maxillary region with a mucous retention cyst inferiorly in the left   maxillary sinus. There is partial opacification of the right mastoid tip   and air cells. The sinuses and mastoids are otherwise clear. The     calvarium appears intact.    IMPRESSION:   1.  Changes of atrophy. No acute intracranial abnormality. Left maxillary   sinus disease and partial opacification of the right mastoid.    Dictation Site: 6        Verified By: Sundra Aland, M.D., MD    17-Apr-14 22:47, CT Head Without Contrast  CT Head Without Contrast  REASON FOR EXAM:    fall hit head  COMMENTS:       PROCEDURE: CT  - CT HEAD WITHOUT CONTRAST  - Nov 04 2012 10:47PM     RESULT: Comparison:  09/25/2012    Technique: Multiple axial images from the foramen magnum to the vertex   were obtained without IVcontrast.    Findings:    There is no evidence for mass effect, midline shift, or extra-axial fluid   collections. There is no evidence for space-occupying lesion,   intracranial hemorrhage, or cortical-based area of infarction. Patchy   subcortical and periventricular hypoattenuation is in keeping with     sequela of chronic microangiopathy.    There is mild mucosal thickening of the left maxillary sinus.    The osseous structures are unremarkable.    IMPRESSION:    No acute intracranial process.      Dictation Site: 8        Verified By: Gregor Hams, M.D., MD    27-Apr-14 02:35, CT Head Without Contrast  CT Head Without Contrast  REASON FOR EXAM:    fall with head injury  COMMENTS:       PROCEDURE: CT  - CT HEAD WITHOUT CONTRAST  - Nov 14 2012  2:35AM     RESULT: Comparison:  11/04/2012    Technique: Multiple axial images from the foramen magnum to the vertex   were obtained without IV contrast.    Findings:      There is no evidence of mass effect, midline shift, or extra-axial fluid   collections.  There is no evidence of a space-occupying lesion or   intracranial hemorrhage. There is no evidence of a cortical-based areaof     acute infarction. There is generalized cerebral atrophy. There is   periventricular white matter low attenuation likely secondary to   microangiopathy.    The ventricles and sulci are appropriate for the  patient's age. The basal   cisterns arepatent.    Visualized portions of the orbits are unremarkable. The visualized   portions of the paranasal sinuses and mastoid  air cells are unremarkable.   Cerebrovascular atherosclerotic calcifications are noted.    The osseous structures are unremarkable.    IMPRESSION:    No acute intracranial process.        Dictation Site: 1        Verified By: Jennette Banker, M.D., MD    [phone] 23:29, CT Head Without Contrast  CT Head Without Contrast  REASON FOR EXAM:    FALL, SCALP LAC  COMMENTS:   May transport without cardiac monitor    PROCEDURE: CT  - CT HEAD WITHOUT CONTRAST  - Nov 22 2012 11:29PM     RESULT: Noncontrast emergent CT of the brain is compared to previous   study dated 27 April2014. There is prominence of the ventricles and   sulci consistent with diffuse atrophy. Low-attenuation is present within   the periventricular and subcortical white matter most consistent with   chronic microvascular ischemic disease. There is somesclerosis in the   right mastoid tip as noted previously. The remaining sinuses and mastoid   air cells appear to show normal aeration. No depressed skull fracture is   present    Since the previous study and an earlier exam of 04 November 2012 there     appears to be a small extra-axial fluid collection of the right frontal   region. This causes approximately 3 mm separation not present previously   between the inner table of the calvarium and cortical surface in the area   of image 17. This does not appear to represent an acute subdural fluid   collection. MRI followup and neurosurgical followup would be recommended.    IMPRESSION:   1. Changes of atrophy with chronic microvascular ischemic disease.  2. Probable tiny small subdural fluid collection which appears to be new.   Followup MRI would be recommended if the patient is a candidate.   Otherwise followup CT would be recommended in 24 hours 36  hours.    Dictation Site: 6(*)      Verified By: Sundra Aland, M.D., MD    15-May-14 01:54, CT Head Without Contrast  CT Head Without Contrast  REASON FOR EXAM:    fall  COMMENTS:       PROCEDURE: CT  - CT HEAD WITHOUT CONTRAST  - Dec 02 2012  1:54AM     RESULT: Noncontrast emergent CT of the brain is performed in the standard   fashion with comparison made to previous examination from 5 May2014.    There is prominence of the ventricles and sulci consistent with atrophy.   There is low-attenuation in the periventricular and subcortical white   matter most consistent with chronic microvascular ischemic disease. There   is no evidence of intracranial hemorrhage, mass, mass effect or midline   shift. No evolving infarct is evident. The orbital structures appear   unremarkable. The sinuses and mastoid air cells show grossly normal   aeration. With some sclerosis on the right which is unchanged. No     evolving infarct is evident. Followup MRI may be beneficial for further   assessment if there is concern for an acute intracranial abnormality or   evolving infarct.    IMPRESSION:  No acute intracranial abnormality or significant interval   change. Changes of atrophy with chronic microvascular ischemic disease   are present.    Dictation Site: 1        Verified By: Sundra Aland, M.D., MD    13-Jun-14 21:25, CT Head Without  Contrast  CT Head Without Contrast  REASON FOR EXAM:    fall and head bleed  COMMENTS:       PROCEDURE: CT  - CT HEAD WITHOUT CONTRAST  - Dec 31 2012  9:25PM     RESULT: Technique: Helical noncontrasted 5 mm sections were obtained from   the skull base through the vertex.    Findings: Diffuse cortical and cerebellar atrophy is identified as well   as diffuse areas of low attenuation within the subcortical, deep and   periventricular white matter regions. There is not evidence of   intra-axial nor extra-axial fluid collections, acute  hemorrhage, mass   effect, nor a depressed skull fracture. The visualized paranasal sinuses   and mastoid air cells are patent. A scalp hematoma is identified   posteriorly on the right.  IMPRESSION:  Chronic and involutional changes without evidence of acute   abnormalities. If there is persistent clinical concern further evaluation   with MRI is recommended.  2. Scalp hematoma on the right  3. A preliminary faxed report was relayed to the patient's floor.          Thank you for the opportunityto contribute to the care of your patient.        Verified By: Mikki Santee, M.D., MD   ASSESSMENT AND PLAN:  Assessment/Admission Diagnosis BLE ulcerations with pain and cellulitis Chronic venous stasis changes   Plan Would place patients into UN[facility] Boots today Would changes these in the office weekly until healed would use 7-10 days of oral antibiotics after discharge Will plan outpatient work up for venous insufficiency as venous reflux study not available in our hospital.     level 3   Electronic Signatures: Algernon Huxley (MD)  (Signed 16-Jun-14 13:42)  Authored: Chief Complaint and History, PAST MEDICAL/SURGICAL HISTORY, ALLERGIES, HOME MEDICATIONS, Family and Social History, Review of Systems, Physical Exam, LABS, RADIOLOGY, Assessment and Plan   Last Updated: 16-Jun-14 13:42 by Algernon Huxley (MD)

## 2014-11-10 NOTE — H&P (Signed)
PATIENT NAME:  Kaitlin Hodges, Kaitlin Hodges MR#:  [id number] DATE OF BIRTH:  06/30/1926  DATE OF ADMISSION:  12/04/2012  REFERRING PHYSICIAN:  Dr. Benjaman Lobe.   PRIMARY CARE PHYSICIAN:  Dr. Brynda Greathouse.   CHIEF COMPLAINT:  Right upper extremity cellulitis, multiple falls.   HISTORY OF PRESENT ILLNESS:  This is an 81 year old Caucasian female with history of likely advanced dementia, multiple falls recently resulting in a right hip fracture, status post repair in March who resides at [place] [facility] at [facility]. I discussed case with the ER staff and the patient's sister as the patient is unable to provide much history. The patient apparently has had multiple falls and falls at least weekly per her sister. The last fall was a couple of days ago. Of note, she presented to the ED on the 15th and a CAT scan of the head was done post fall which showed no acute intracranial abnormalities besides the chronic microvascular ischemic changes. She did have a laceration on the dorsum of the right hand, which required Steri-Strips and the patient was discharged. The patient has developed a right upper extremity cellulitis and was brought back in. The patient is unable to provide any history. She denies any pains, shortness of breath. She knows she is in the hospital, but cannot provide much. The patient appears very unkempt and dehydrated, and appears very weak, otherwise. The Hospitalist service was contacted for further evaluation and management.   PAST MEDICAL HISTORY:  1.  Likely severe dementia.  2.  A recent hip fracture status post surgical repair.  3.  Delirium.  4.  Hypertension.  5.  GERD.   6.  Hyperlipidemia.  7.  A history of recent falls with rhabdomyolysis in the last hospitalization.   ALLERGIES:  No known drug allergies.   OUTPATIENT MEDICATIONS:  At this point, we do not have the outpatient medications, but the patient has been previously, and more recently, on Percocet 5/325 for pain, atorvastatin 10 mg  daily, multivitamin, metoprolol 25 mg b.i.d. some constipation medicines but we are awaiting the MAR from [facility] for full medication list.   FAMILY HISTORY:  Unable to obtain, otherwise.   SOCIAL HISTORY:  Per chart, no tobacco, nondrinker, lives at [facility] in the [place] [facility].   REVIEW OF SYSTEMS:  Unable to obtain given significant dementia.   PHYSICAL EXAMINATION:  VITAL SIGNS:  Temperature 98.7, pulse rate 87, respiratory rate 18, blood pressure 142/49, O2 sat 97% on room air.  GENERAL:  The patient is an elderly, chronically ill-appearing, unkempt, Caucasian female lying in bed.  HEENT:  Normocephalic, atraumatic. Pupils are equal and reactive. Very dry mucous membranes. Anicteric sclerae.  NECK:  Supple. No thyroid tenderness. No cervical lymphadenopathy.  CARDIOVASCULAR:  S1, S2 regular rate and rhythm. No murmurs, rubs, or gallops.  LUNGS:  Clear to auscultation without wheezing but the patient has poor effort.  ABDOMEN:  Soft, nontender, nondistended. Positive bowel sounds in all quadrants.  EXTREMITIES:  No significant edema. There is chronic venostasis changes on the lower extremities with scattered abrasions and bruising and scars with eschar. There are 2 eschar formed ulcers on the dorsum of bilateral feet, 3 cm likely on the left and perhaps 2 cm on the right and scabs and abrasions on the right elbow area. There are Steri-Strips applied to the right dorsal hand without active bleeding. There is significant erythema, tenderness, redness, and some edema from right above the elbow mid to the shoulder. There is no  active drainage, otherwise.  NEUROLOGIC:  The patient is unable to comply for a full neurological exam, but the patient are moving all extremities spontaneously although weakly, and it appears to be a midline tongue protrudes symmetrical.  PSYCHIATRIC:  Awake, alert, oriented x 1. She knows she is in the hospital, but cannot provide much information,  otherwise, pleasant and cooperative to the best of her capability, however.  LABORATORY, DIAGNOSTIC, AND RADIOLOGICAL DATA:  Glucose 111, creatinine 0.84, BUN 13, sodium 134, potassium 4.3, serum CO2 25, total protein 7.6, albumin 2.6, alk phos 171, AST, ALT within normal limits. WBC 15.6, hemoglobin 11.6, platelets are 305.   EKG:  Sinus rhythm with sinus arrhythmia, rate 78, no acute ST elevations or depressions. X-ray of the arm done, not read yet, but no obvious fractures.   ASSESSMENT AND PLAN:  We have an 81 year old, pleasantly confused female with what appears to be advanced dementia, living in [facility] with multiple falls in the last several months; one including hospitalization for a hip fracture with repeat falls recently in the last couple of days with ER visits and a CAT scan of the head as well as abrasions of the upper extremities, presents with cellulitis of the right upper extremity with leukocytosis. The patient is unable to provide a history but would admit the patient to the hospital as the arm looks angry and cellulitic and requires IV antibiotics at this point. I did obtain blood cultures, started the patient on IV fluids. The patient appears to be unkempt with rather long fingernails and scattered abrasions and scabs and some ulcers in the bilateral dorsal of the feet. I did obtain a Wound consult, Physical Therapy consult and Case Management consult. I do not know if she is in the best place for her at this point with these recent falls and perhaps she needs a high level of care or not. Would continue her beta blocker and statin for blood pressure and hyperlipidemia, Would minimize opiates if she is not in pain, start her on a bowel regimen, start her on some Tylenol p.r.n. for pain and heparin for deep vein thrombosis prophylaxis.   CODE STATUS:  PER SISTER, JANICE WALKER, SHE IS A DO NOT RESUSCITATE.  TOTAL TIME SPENT:  50 minutes.  ____________________________ Vivien Presto, MD sa:jm D: 12/04/2012 13:55:08 ET T: 12/04/2012 17:09:48 ET JOB#: [id number]  cc: Vivien Presto, MD, <Dictator> Mikeal Hawthorne. Brynda Greathouse, MD Vivien Presto MD ELECTRONICALLY SIGNED 12/21/2012 12:40

## 2014-11-10 NOTE — Discharge Summary (Signed)
PATIENT NAME:  Kaitlin Hodges, Kaitlin Hodges MR#:  [id number] DATE OF BIRTH:  Sep 13, 1925  DATE OF ADMISSION:  12/31/2012 DATE OF DISCHARGE:  01/05/2013  PRIMARY CARE PHYSICIAN:  Dr. Maryellen PileEason.  FINAL DIAGNOSES: 1. Bilateral lower extremity ulcers and swelling with cellulitis.  2. Possible diastolic congestive heart failure.  3. Hypertension.  4. Dementia.  5. Frequent falls with hematoma. Recommend a BMP on Monday with results to Dr. Maryellen PileEason.   MEDICATIONS ON DISCHARGE: Include: 1. Metoprolol 25 mg twice a day.  2. Ferrous sulfate 325 mg twice a day.  3. Nicotine patch 7 mg per 24-hour chest film one patch daily. 4. Cerovite Senior a therapeutic multivitamin with minerals oral tablet 1 tablet daily. 5. Seroquel 25 mg 2 tablets once a day in the evening.  6. Senna lax 8.6 mg 1 tablet daily.  7. Trazodone 50 mg at bedtime.  8. Vitamin D3 at 2000 international units daily. 9. Imodium AD 2 mg every 3 hours as needed for loose stool, do not exceed 8 doses in 24 hours. 10. Maalox advanced maximum strength 2 tablespoons up to 4 times a day as needed for heartburn.  11. Tylenol  500 one tablet every 4 hours as needed for fever.  12. Milk of magnesia 30 mL once a day at bedtime as needed for constipation. 13. Seroquel 25 mg 1/2 tablet in the morning. 14. Wellbutrin 1 tablet twice a day 150 mg SR.  15. Lorazepam 0.5 mg 1/2 tablet every 8 hours as needed for agitation.  16. Bactrim 1 tablet every 12 hours for 6 days.  17. Potassium chloride 20 mEq extended-release daily for 5 days.   The patient was admitted 12/31/2012, discharged 01/05/2013. The patient came in with swelling of both legs.   HISTORY OF PRESENT ILLNESS: An 81 year old female with advanced dementia, agitation, multiple falls who lives at [facility], brought in with lower extremity swelling, erythema and skin breakdown. She was admitted and started on IV vancomycin and Zosyn. She was started on low-dose Lasix and an  echocardiogram was ordered.   LABORATORY AND RADIOLOGICAL DATA: White blood cell count of 7.9, hemoglobin and hematocrit 10.8 and 32.7, platelet count 298.  Glucose 113, BUN 16, creatinine 0.87, sodium 143, potassium 3.8, chloride 108, CO2 27, calcium 9.3. Ultrasound of the lower extremities was negative. BNP 1197. Chest x-ray showed no acute abnormality. EKG showed sinus rhythm, premature supraventricular complexes, Blood cultures negative. CT scan of the head showed scalp hematoma on the right. No evidence of other acute abnormalities. Troponins remain negative. Echocardiogram showed ejection fraction 60% to 65%, impaired relaxation pattern, LV diastolic filling, mild concentric left ventricular hypertrophy. Urinalysis negative. Potassium in the morning of the 18th was 2.7, it was vigorously replaced and in the afternoon was 3.7. Magnesium was 1.9.   HOSPITAL COURSE PER PROBLEM LIST:  1. For the patient's bilateral lower extremity ulcers and swelling and cellulitis, initially the patient was started on vancomycin and Zosyn and was switched over to p.o. Bactrim upon discharge. I did consult Dr. Wyn Quakerew, vascular surgery, who placed the patient on Unna boots and these Unna boots will be changed once a week through Dr. Driscilla Grammesew's office. He will continue to follow up the patient for wound healing on the lower extremities.  2. Possible diastolic congestive heart failure.  I am not so sure that this is heart failure. The patient was diuresed with Lasix. Chest x-ray was negative. Echocardiogram showed good EF. I stopped the Lasix upon discharge since the  potassium went low.  You can use Lasix as needed as outpatient, but I would rather not do that secondary to her age and dementia. The patient is already on metoprolol.  3. Hypertension. Blood pressure is stable on metoprolol.  4. Advanced dementia. The patient does have frequent falls and did end up having a fall here with a scalp hematoma.  [facility] did evaluate  and are able to take back and it is going to be difficult to manage this patient as outpatient with her frequent falls and dementia. Hopefully, she can ambulate with physical therapy, so she does not need to ambulate on her own.  TIME SPENT ON DISCHARGE: 35 minutes.   ____________________________ Herschell Dimes. Renae Gloss, MD [id number] D: 01/05/2013 16:40:16 ET T: 01/05/2013 17:23:12 ET JOB#: [id number]  cc: Herschell Dimes. Renae Gloss, MD, <Dictator> Serita Sheller. Maryellen Pile, MD  Salley Scarlet MD ELECTRONICALLY SIGNED 01/06/2013 13:17

## 2014-11-10 NOTE — Consult Note (Signed)
PATIENT NAME:  Kaitlin Hodges, Kaitlin Hodges MR#:  [id number] DATE OF BIRTH:  [birth year]  DATE OF CONSULTATION:  09/25/2012  REFERRING PHYSICIAN:  Claud Kelp, MD CONSULTING PHYSICIAN:  Vilinda Boehringer, MD  EMERGENCY DEPARTMENT PHYSICIAN: Sheryl L. Benjaman Lobe, MD  PRIMARY CARE PHYSICIAN: Mikeal Hawthorne. Brynda Greathouse, MD  CHIEF COMPLAINT: "I fell."   HISTORY OF PRESENT ILLNESS: This is an 81 year old with no significant past medical history except for mild to moderate dementia that was brought in by the family after being found down this morning for an unknown amount of time. History is limited per the patient because of dementia. Most of the history is obtained from the sister Thayer Headings. Her phone number is [phone]. Per sister, who is the healthcare power of attorney, she stated that the patient normally lives by herself. She lives actually on the same property as other family members and they constantly check in on her. This morning, they went in to check on her and noticed that she was on the floor. She was unable to get up and she said that she does not recall falling, passing out, but she does say that she slept in her bed last night. Upon arrival to the ED, she was noted to have bruises on her right shoulder, left shoulder. X-rays show that she had a right hip fracture. Orthopedic surgery was called who plans for surgery at some point within the next 24 hours and requested hospitalist for preop cardiac clearance. Per the sister, the patient has no smoking or drinking history. She normally follows with Dr. Brynda Greathouse once a year. She has never had to be put on any kind of cholesterol or blood pressure medication. She has never had a stress test done. As far as she knows, she has a good heart. She has never had an echo done as far as she can remember. The patient had a hip surgery done for a left hip fracture 25 years ago at the old hospital in [place], [place]. She did have any adverse events from the surgery or from  anesthesia at that time. Hospitalist services were consulted for further recommendations on preop cardiac clearance for hip surgery.   PAST MEDICAL HISTORY: Mild to moderate dementia.  MEDICATIONS: Tylenol 500 mg as needed at home b.i.d., Centrum Silver 1 tablet daily.   PAST SURGICAL HISTORY: Left hip surgery 25 years ago.   ALLERGIES: No known drug allergies.   FAMILY HISTORY: Not significant.   SOCIAL HISTORY: Nonsmoker, nondrinker. Currently lives on the same property as her other family members but lives in a building by herself.   REVIEW OF SYSTEMS:  CONSTITUTIONAL: Limited as the patient is demented, but she denies any fever, fatigue, weakness, weight gain or weight loss.  EYES: No blurry vision, double vision or pain.  ENT: No tinnitus or ear pain.  RESPIRATORY: No cough, no wheeze, no hemoptysis.  CARDIOVASCULAR: No chest pain, no shortness of breath, no dyspnea on exertion.  GASTROINTESTINAL: No nausea, vomiting, diarrhea. No abdominal pain.  GENITOURINARY: No dysuria. ENDOCRINE: No thyroid problems that she can recall. No constant nocturia.  HEMATOLOGIC AND LYMPHATIC: No anemia, easy bruising or bleeding or swollen glands.  INTEGUMENTARY: No acne, rash, change in mole, hair or skin.  MUSCULOSKELETAL: Pain in her hip, right side.  NEUROLOGIC: Positive weakness on the right side of her body,  PSYCHIATRIC: Demented.   PHYSICAL EXAMINATION:  VITAL SIGNS: In the ED, she was noted to have a blood pressure of 159/72, pulse 99, respirations 16, temperature  22.  GENERAL APPEARANCE: Well-developed, well-nourished female lying in bed in no acute respiratory distress.  HEENT: PERRLA, EOMI. No scleral icterus. No difficulty hearing. Positive halitosis and dry mucosal membranes are noted.  NECK: No thyroid enlargement. Neck is supple, nontender.  RESPIRATORY: Clear to auscultation bilaterally. No rales, rhonchi, crackles. Mild diminished breath sounds noted at the bases due to poor  inspiratory effort.  CARDIOVASCULAR: Regular rate, regular rhythm. S1, S2 auscultated. No PMI lateralization. Good radial and pedal pulses. No lower extremity edema is noted.  ABDOMEN: Soft, nontender, nondistended. Positive bowel sounds.  MUSCULOSKELETAL: 4/5 strength bilateral upper and lower extremities. Noted to have moderate pain to palpation of the right hip.  SKIN: She does have a bruise on the right shoulder, left shoulder. She is noted to have a skin tear on the anterior portion of her right lower extremity that is about maybe a stage I. It is about 4 cm x 2 cm. Nonbleeding.  LYMPHATIC: No adenopathy noted in the cervical, axilla or supraclavicular regions.  NEUROLOGIC: Cranial nerves II through XII intact. She is able to follow limited commands. Sensory is intact.  PSYCHIATRIC: She is alert and oriented to place. Cooperative. Poor judgment is noted. Memory is impaired. She is mildly confused. She is not agitated at this time. She is cooperative during the exam.   PERTINENT LABORATORY, DIAGNOSTIC AND RADIOLOGIC DATA: Sodium 138, potassium 3.6, chloride 104, bicarbonate 28, BUN 13, creatinine 0.84 glucose 123. She did have a CK total of 1721. She was noted to have a CK-MB of 25.1. White cell count 14.4, platelet count 242, hemoglobin 14.0, hematocrit 41.7. PT 13.1, INR 1.0. Urinalysis does not show any leukocyte esterase, nitrites or moderate amount of bacteria. She had an EKG done that is normal sinus rhythm. Motion artifact is mostly noted. Otherwise, the rate is about 90 beats per minute. She had a CT of the head that showed no acute intracranial abnormality. Left maxillary sinus disease and partial opacification of the right mastoid is noted. She had an alcohol level that was less than 3. She had a right hip x-ray that showed a right femoral neck fracture. She had a right humerus that showed right humerus image that is somewhat underexposed. No gross bony abnormality is evident, but is there is  concern for fracture in the humeral head or elbow, then dedicated images of the shoulder or elbow should be done. She had left elbow complete imaging done. The study is limited by significant artifact as discussed above, but states that definitive fracture not appreciated but given the significant artifact, followup images would be recommended. She had pelvis x-ray done that showed degenerative changes seen diffusely in the lumbar spine. Orthopedic screws were present through the left femoral neck into the femoral head. She had chest x-ray done: Mildly interstitial prominent findings, likely fibrotic. The heart is borderline enlarged. The lungs are hyperinflated consistent with COPD. No pneumothorax is evident. The ribs appear to be intact on this. No fractures are noted.   ASSESSMENT AND PLAN: An 81 year old with past medical history of dementia seen in consultation for preoperative cardiac clearance for right hip fracture repair.  1.  Preoperative clearance: The patient is with no history of coronary artery disease, hypertension, congestive heart failure or hyperlipidemia, although her chest x-ray does show she has a mildly enlarged heart. Otherwise, history obtained from the sister showed that she had not had any previous cardiac workup, or required any previous cardiac workup. Her MET score is between 4 and  10 and she had good functional capacity prior to the fall. She has a low to intermediate risk surgery planned, which is orthopedic with 1% to 5% cardiac complications. She does not require any further cardiac testing at this time. No need for perioperative beta blocker due to no significant clinical risk factors for cardiac disease. Recommend to check troponin. Limit use of benzos in the postoperative period. Conservative use of narcotics given dementia in the elderly in the postoperative period.  2.  Leukocytosis, most likely a stress response. Will need to monitor. No fever, cough. Urinalysis  with no  sign of any infection.  3.  Rhabdomyolysis, CK of 1721, secondary to fall and being immobile for a number of hours. Will continue with fluids, 75 mL/h of normal saline. Monitor urine output.  4.  Dementia, mild to moderate. Continue with supportive care.  5.  Right hip fracture/fracture of the femoral neck. Planned for surgical repair. Pain management per surgery recommendations. Again, conservative use of benzos and narcotics is recommended.  6.  Chest x-ray findings suggestive of chronic obstructive pulmonary disease. No pulmonary function tests can be found on file. She again may have clinical findings. She does not have any significant clinical findings such as cough, chronic shortness of breath, use of albuterol to suggest that she may have definitive chronic obstructive pulmonary disease. If chronic obstructive pulmonary disease workup is warranted, then she will need to follow up as an outpatient with pulmonologist or her primary care physician to have pulmonary function tests done. Her pulmonary complications depend on the use of narcotics and benzos for pain management and type of anesthesia. If the patient is noted to be desaturating in the postoperative period, would recommend to use positive pressure such as CPAP or BiPAP and then she can be bridged to nasal cannula and weaned off as tolerated prior to discharge.  I spoke with her sister Thayer Headings, who is the healthcare power of attorney. Her phone number is [phone]. Updated her on the plan of care.   CODE STATUS: The patient is a full code.   TIME SPENT DICTATING AND EVALUATING PATIENT: 45 minutes.    ____________________________ Vilinda Boehringer, MD vm:jm D: 09/25/2012 18:45:08 ET T: 09/25/2012 20:01:18 ET JOB#: [id number]  cc: Vilinda Boehringer, MD, <Dictator> Vilinda Boehringer MD ELECTRONICALLY SIGNED 09/25/2012 23:49

## 2014-11-10 NOTE — H&P (Signed)
   Subjective/Chief Complaint right hip pain   History of Present Illness 81 y/o demented female who was found down this morning by her son. She cannot recall how she fall and unsure if she had LOC.   Past Med/Surgical Hx:  Denies medical history:   ALLERGIES:  No Known Allergies:   Family and Social History:  Family History Non-Contributory   Place of Living Home   Review of Systems:  Subjective/Chief Complaint right hip[ pain   Fever/Chills No   Cough No   Sputum No   Abdominal Pain No   Diarrhea No   Constipation No   Nausea/Vomiting No   SOB/DOE No   Chest Pain No   Physical Exam:  RESP normal resp effort   CARD regular rate   NEURO motor/sensory function intact   Additional Comments RLE shortened and externally rotated. SILT. weakly palpable pulses.    Assessment/Admission Diagnosis 81 y/o demented female with unwitnessed fall sustaining a right femoral neck fracture   Plan Head CT to r/o head trauma and medical clearance prior to OR for hip hemiarthroplasty   Electronic Signatures: Danelle EarthlyEckel, Tobin T (MD)  (Signed 08-Mar-14 16:54)  Authored: CHIEF COMPLAINT and HISTORY, PAST MEDICAL/SURGIAL HISTORY, ALLERGIES, FAMILY AND SOCIAL HISTORY, REVIEW OF SYSTEMS, PHYSICAL EXAM, ASSESSMENT AND PLAN   Last Updated: 08-Mar-14 16:54 by Danelle EarthlyEckel, Tobin T (MD)

## 2014-11-10 NOTE — Op Note (Signed)
DATE OF BIRTH:  [birth year]  DATE OF PROCEDURE:  09/26/2012  PREOPERATIVE DIAGNOSIS:  Right femoral neck fracture.  POSTOPERATIVE DIAGNOSIS:  Right femoral neck fracture.  PROCEDURE PERFORMED:  Right hip hemiarthroplasty.  SURGEON OF RECORD:  Dr. Thornton Papas   ANESTHESIA:  General.   ESTIMATED BLOOD LOSS: 250 mL.  FLUIDS ADMINISTERED:  800 of  crystalloid.  COMPLICATIONS:  None.  DISPOSITION:  Stable to PACU upon transport.   INDICATION OF PROCEDURE: An 81 year old female status post a fall, sustaining a left displaced femoral neck fracture. The patient and her family were counseled regarding risks and benefits of the procedure. The risks to include but not limited to bleeding, infection, damage to nerves and/or vessels, dislocation and death. She was also counseled of the benefits being early mobilization to prevent decubitus ulceration, pneumonia, and other added comorbidities of being bedridden. The patient and her family agreed to proceed with surgery after weighing these risks.   DESCRIPTION OF PROCEDURE:  The patient was brought to the operating room. She was placed in the lateral position on the pegboard. The right lower extremity was prepped and draped in sterile fashion. A time out was performed with the Anesthesia, nursing staff, surgeon of record to confirm surgical site, patient, medical record number, date of birth, laterality, procedure to be performed and confirmation. Preoperative antibiotics were given. A posterior hip approach was performed with an approximately 15 cm incision along the posterior aspect of the greater trochanter. Dissection down to the level of gluteal fascia, which was split sharply with a knife. Fibers of gluteus maximus were separated down to the level of the hip external rotators. The piriformis was taken down and tagged with suture, and a capsulotomy was performed to expose the fractured femoral neck.  At this point, corkscrew  was used to remove the  head. The head measured approximately a size 48. We then revised our neck cut, and then used the lateralizer and box cutter followed by the canal finder to enter the femoral canal. We then broached sequentially starting with a 1 and up to a 2, which was found to have a good fit. We trialed the 48 + 0 head, and found that it was too tight and difficult to relocate, so we next trialed the 48 with a -3 taper, and thought we had good range of motion and stability with that size. We removed our broach, and then placed our cement restrictor. We next pulse lavaged the canal and dried the canal in preparation for cementing. We next placed our cement with the assistance of the pressurizer in the femoral canal, and then inserted our stem, with care to maintain the appropriate anteversion. We waited the allotted 17 minutes for the cement to cure, and then we once again trialed our head, and were again pleased with the stability and the range of motion. We next put our final 48 - 3 head on, and tapped it with a mallet to secure the [facility] taper. We once again reduced the hip, and again through its range of motion, we were pleased with both the motion and stability. We then once again irrigated the wound, and then repaired the capsule through a drill hole in the greater trochanter in the abductor tendon insertion. We also reattached the piriformis to the abductor tendon insertion. We next closed the fascia with 0 Vicryl, and then did a layered closure with 2-0 Vicryl, and staples in the skin. The patient was placed in abduction pillow and transferred to the  PACU in stable condition.    ____________________________ Kaitlin Earthlyobin T. Kemani Demarais, MD tte:mr D: 09/26/2012 20:55:23 ET T: 09/26/2012 21:17:12 ET JOB#: [id number]  cc: Kaitlin Earthlyobin T. Tonimarie Gritz, MD, <Dictator> Kaitlin EarthlyBIN T Damontre Millea MD ELECTRONICALLY SIGNED 09/27/2012 21:48

## 2014-11-10 NOTE — Discharge Summary (Signed)
PATIENT NAME:  Kaitlin Hodges, Kaitlin Hodges MR#:  [id number] DATE OF BIRTH:  02/09/1926  DATE OF ADMISSION:  09/25/2012 DATE OF DISCHARGE: 09/29/2012   ADMITTING DIAGNOSIS: Right femoral neck fracture.   DISCHARGE DIAGNOSIS: Right femoral neck fracture.  OPERATION: On 09/26/2012, the patient had surgery by Dr. Rodena MedinEckel.   ANESTHESIA: General.   ESTIMATED BLOOD LOSS: 250 mL.  FLUIDS: 800 mL of crystalloid.   COMPLICATIONS: None.   DISPOSITION: The patient was stable and brought to the recovery room and brought down to transport.   HISTORY: The patient is an 81 year old female, who presented with pain involving her right hip. The patient was found by her son and could not ambulate. The patient does not remember falling secondary to dementia.   PHYSICAL EXAMINATION:  GENERAL: Female with slight confusion with communication. Normal affect.  RESPIRATION: Normal.  CARDIAC: Regular rate and rhythm. EXTREMITIES: Once again with regard to the right lower extremity, there is shortening and external rotation. The patient has swelling of the thigh. The patient had palpable pulses with neurovascular intact.   X-rays revealed a femoral neck fracture.   HOSPITAL COURSE: After initial admission on 09/25/2012, the patient was taken to the operating room the following day. On postoperative day 1, the patient had a hemoglobin of 10.8, which dropped down to 9.7 on postoperative day 2. This remained stable. The patient had stable vitals and was able to ambulate about 8 feet with physical therapy. The patient was ready to go to rehab on 09/29/2012.   CONDITION AT DISCHARGE: Stable.   DISPOSITION: The patient was sent to rehab for physical therapy. The patient will do weight bear as tolerated on the affected leg. The patient will raise her leg on 1 to 2 pillows. The patient will use knee-high TED hose on both legs, remove at bedtime. The patient will use incentive spirometer. The patient will resume regular diet. The  patient will have dressing change on a p.r.n. basis and try to keep her dressing on and leave it intact. The patient will call the clinic or the rehab can call the clinic if there is bright red bleeding or fever greater than 101.5, any calf pain, bowel or bladder difficulty. The patient will do physical therapy and occupational therapy daily. The patient will follow up in 2 weeks for staple removal.   DISCHARGE MEDICATIONS: Tylenol 500 mg 1 tablet b.i.d. or p.r.n. for arthritis and pain,  Centrum Silver 1 tablet p.o. daily, Percocet 5/325 mg 1 to 2 tablets q. 4 to 6 hour p.r.n. for severe pain, nitroglycerin 0.4 mg sublingual 1 tablet p.r.n. for chest pain, Lovenox 40 mg subcu once a day, atorvastatin 10 mg p.o. daily, metoprolol 25 mg p.o. b.i.d., ferrous sulfate 325 mg 1 tablet b.i.d., Senokot 1 tablet p.o. daily and docusate sodium 100 mg orally 1 capsule b.i.d.    ____________________________ J. Dedra Skeensodd Dashanna Kinnamon, [place] jtm:aw D: 09/29/2012 07:37:37 ET T: 09/29/2012 07:48:31 ET JOB#: [id number]  cc: J. Dedra Skeensodd Akon Reinoso, [place], <Dictator> J Inetta Dicke [facility] Center[facility]UNDY PA ELECTRONICALLY SIGNED 09/29/2012 13:25

## 2014-11-10 NOTE — Discharge Summary (Signed)
PATIENT NAME:  Kaitlin DraftHOMAS, Rafaella L MR#:  [id number] DATE OF BIRTH:  [birth year]  DATE OF ADMISSION:  12/04/2012 DATE OF DISCHARGE:  12/06/2012  ADMISSION DIAGNOSIS: Cellulitis.   DISCHARGE DIAGNOSES: 1.  MRSA Arm cellulitis after a fall.  2.  Mechanical fall.  3.  Dementia.  4.  Skin wounds on her feet.  5.  Hypertension.   CONSULTS: Physical therapy.   LABORATORY AND RADIOLOGIC DATA:  White blood cells 10.5, hemoglobin 10, hematocrit 29, platelets 253. Sodium 132, potassium 3.6, chloride 102, bicarbonate 22, BUN 10, creatinine 0.65, glucose 95, magnesium 1.7. Blood cultures contaminant staph epi Wound culture MRSA CT of the head showed no acute intracranial hemorrhage or CVA.   HOSPITAL COURSE: This is an 81 year old female who was brought in after a fall, had a cellulitis to her elbow. For further details, please refer to the H and P.  1.  MRSA Elbow cellulitis after a fall.  The cellulitis is much improved. She has some scabs at her elbow and just minimal erythema. Her x-ray of her elbow showed no acute fracture. She will continue on p.o.bactrim  2.  Skin wounds on her feet. She will need wet-to-dry dressings and Silvadene. She will need careful attention to her feet daily and MD at the facility to see this and make recommendations.  3.  Dementia. The patient will continue her medications.  4.  Falls  PT recommends long-term care facility.  5.  Hypertension was well controlled.  6.  Hypomagnesemia was repleted prior to discharge.  76.  Hyponatremia secondary to dehydration. improved with IVF  DISCHARGE MEDICATIONS: 1.  Metoprolol 25 mg b.i.d.  2.  Ferrous sulfate 325 b.i.d.  3.  Nicotine patch 7 mg/24 hours.  4.  Bupropion 150 mg b.i.d.  5.  Cerovite senior therapeutic multivitamin 1 tablet daily.  6.  Quetiapine 25 mg 2 tablets at bedtime.  7.  Senna laxative 0.6 mg daily.  8.  Trazodone 50 mg at bedtime.  9.  Vitamin D3 2000 international units daily.  10.  Imodium 1 tablet q.3  hours with each loose stool p.r.n.  11.  Iophen NR 2 tsp every 6 hours as needed for cough.  12.  Lorazepam 0.5 mg every 8 hours p.r.n. agitation.  13.  Maalox 2 tsp up to 4 times a day p.r.n. heartburn.  14.  Mapap 500 mg 1 tablet q.4 hours for fever or headache.  15.  Milk of magnesia 8%, 30 mL at bedtime p.r.n.  16.  Quetiapine 25 mg, 1/2 tablet in the morning.  17.  Bactrim DS BID for 10 days.  18.  Silvadene apply topically to affected area to feet b.i.d.   DRESSING CARE: Keep dressing dry.  Use Silvadene b.i.d. to feet.    DISCHARGE DIET: Regular, mechanical soft.  DISCHARGE ACTIVITY: PHYSCIAL THERAPY IS RECOMMENDED.  FOLLOWUP:  The patient will need to see the MD at the facility this week for her feet wounds.  In addition, she should have a repeat a BMP in 4 days.The patient is medically stable for discharge.   TIME SPENT: Approximately 40 minutes on this discharge.   ____________________________ Brittannie Tawney P. Juliene PinaMody, MD spm:cs D: 12/05/2012 13:06:19 ET T: 12/05/2012 14:11:49 ET JOB#: [id number]  cc: Taggart Prasad P. Juliene PinaMody, MD, <Dictator> Janyth ContesSITAL P Lamoine Magallon MD ELECTRONICALLY SIGNED 12/06/2012 13:20

## 2014-11-10 NOTE — Discharge Summary (Signed)
PATIENT NAME:  Kaitlin Hodges, Kaitlin Hodges MR#:  [id number] DATE OF BIRTH:  1925/12/01  DATE OF ADMISSION:  09/25/2012 DATE OF DISCHARGE:  10/02/2012  ADDENDUM   The patient was cleared from medicine, Dr. Jacques NavyAhmadzia. The patient will continue low dose of Lopressor. She will be weightbearing as tolerated with physical therapy upon discharge. Leukocytosis is felt to be likely secondary to stress. No further change in medications upon discharge. Follow up with Dr. Kennedy BuckerMichael Menz as previously noted in discharge summary.    ____________________________ Murlean HarkShalini Saretta Dahlem, MD sr:lg D: 10/02/2012 15:22:51 ET T: 10/02/2012 15:31:39 ET JOB#: [id number]  cc: Murlean HarkShalini Susa Bones, MD, <Dictator> Murlean HarkSHALINI Partick Musselman MD ELECTRONICALLY SIGNED 10/29/2012 15:19

## 2014-11-10 NOTE — Consult Note (Signed)
Present Illness Patient is an 81 yo with dementia who presents after falling, fracturing hip Per the patient's family she is relatively active for age.  Gets around [place] to climb stairs to bedroom without problem.  No history of CP or SOB per their report Doing OK on Friday night.  Saturday found on floor  Unclear how long she had been down  Bruised, unable to move.  Brought to [facility]  Intial EKG  SR.    No Known Allergies:   Case History and Physical Exam:  Chief Complaint Preop evaluation.  No CP  NO SOB>   Past Medical Health Other, Dementia   Past Surgical History hip surgery   Family History Non-Contributory   HEENT PERLA   Neck/Nodes Other  JVP is normal  No bruits.   Chest/Lungs Other  CTA   Cardiovascular Other  RRR.  Normal S1, S2.  No S3.  No murmurs  2+ pulses LE  No LE edema.  Extremites warm.   Abdomen Benign  Other  No masses  No hepatomegaly   Genitalia Not examined   Rectal Not examined   Musculoskeletal Other  R leg in with boot.   Neurological Other  Patient a little confused.   Nursing/Ancillary Notes: **Vital Signs.:   09-Mar-14 05:48  Vital Signs Type Q 4hr  Temperature Temperature (F) 98.6  Celsius 37  Temperature Source oral  Pulse Pulse 93  Respirations Respirations 20  Systolic BP Systolic BP 867  Diastolic BP (mmHg) Diastolic BP (mmHg) 59  Mean BP 86  Pulse Ox % Pulse Ox % 92  Pulse Ox Activity Level  At rest  Oxygen Delivery Room Air/ 21 %     Hepatic:  08-Mar-14 15:00   Bilirubin, Total 0.9  Alkaline Phosphatase 91  SGPT (ALT) 28  SGOT (AST)  67  Total Protein, Serum 7.8  Albumin, Serum 3.5  Routine Chem:  08-Mar-14 15:00   Ethanol, S. < 3  Ethanol % (comp) < 0.003 (Result(s) reported on 25 Sep 2012 at 03:45PM.)  Glucose, Serum  123  BUN 13  Creatinine (comp) 0.84  Potassium, Serum 3.6  Chloride, Serum 104  CO2, Serum 28  Calcium (Total), Serum 9.0  Osmolality (calc) 277  eGFR (African American) >60  eGFR  (Non-African American) >60 (eGFR values <8m/min/1.73 m2 may be an indication of chronic kidney disease (CKD). Calculated eGFR is useful in patients with stable renal function. The eGFR calculation will not be reliable in acutely ill patients when serum creatinine is changing rapidly. It is not useful in  patients on dialysis. The eGFR calculation may not be applicable to patients at the low and high extremes of body sizes, pregnant women, and vegetarians.)  Anion Gap  6    20:40   Result Comment TROPONIN - RESULTS VERIFIED BY REPEAT TESTING.  - C/MARY RAYMOND/2145/09-25-12/RWW  - READ-BACK PROCESS PERFORMED.  Result(s) reported on 25 Sep 2012 at 09:45PM.  Cardiac:  08-Mar-14 15:00   CPK-MB, Serum  25.1 (Result(s) reported on 25 Sep 2012 at 03:49PM.)  CK, Total  1721    20:40   Troponin I  1.40 (0.00-0.05 0.05 ng/mL or less: NEGATIVE  Repeat testing in 3-6 hrs  if clinically indicated. >0.05 ng/mL: POTENTIAL  MYOCARDIAL INJURY. Repeat  testing in 3-6 hrs if  clinically indicated. NOTE: An increase or decrease  of 30% or more on serial  testing suggests a  clinically important change)  CPK-MB, Serum  50.7 (Result(s) reported on 25 Sep 2012 at 09:31PM.)  Routine UA:  08-Mar-14 15:00   Color (UA) Yellow  Clarity (UA) Clear  Glucose (UA) 50 mg/dL  Bilirubin (UA) Negative  Ketones (UA) 1+  Specific Gravity (UA) 1.014  Blood (UA) 2+  pH (UA) 7.0  Protein (UA) 30 mg/dL  Nitrite (UA) Negative  Leukocyte Esterase (UA) Negative (Result(s) reported on 25 Sep 2012 at 03:27PM.)  RBC (UA) 3 /HPF  WBC (UA) 1 /HPF  Bacteria (UA) TRACE  Epithelial Cells (UA) NONE SEEN  Mucous (UA) PRESENT (Result(s) reported on 25 Sep 2012 at 03:27PM.)  Routine Coag:  08-Mar-14 15:00   Prothrombin 13.1  INR 1.0 (INR reference interval applies to patients on anticoagulant therapy. A single INR therapeutic range for coumarins is not optimal for all indications; however, the suggested range for most  indications is 2.0 - 3.0. Exceptions to the INR Reference Range may include: Prosthetic heart valves, acute myocardial infarction, prevention of myocardial infarction, and combinations of aspirin and anticoagulant. The need for a higher or lower target INR must be assessed individually. Reference: The Pharmacology and Management of the Vitamin K  antagonists: the seventh ACCP Conference on Antithrombotic and Thrombolytic Therapy. HERDE.0814 Sept:126 (3suppl): [id number]. A HCT value >55% may artifactually increase the PT.  In one study,  the increase was an average of 25%. Reference:  "Effect on Routine and Special Coagulation Testing Values of Citrate Anticoagulant Adjustment in Patients with High HCT Values." American Journal of Clinical Pathology 2006;126:400-405.)    22:22   Activated PTT (APTT) 27.5 (A HCT value >55% may artifactually increase the APTT. In one study, the increase was an average of 19%. Reference: "Effect on Routine and Special Coagulation Testing Values of Citrate Anticoagulant Adjustment in Patients with High HCT Values." American Journal of Clinical Pathology 2006;126:400-405.)  Routine Hem:  08-Mar-14 15:00   WBC (CBC)  14.4  RBC (CBC) 4.38  Hemoglobin (CBC) 14.0  Hematocrit (CBC) 41.7  Platelet Count (CBC) 242 (Result(s) reported on 25 Sep 2012 at 03:23PM.)  MCV 95  MCH 32.0  MCHC 33.5  RDW 12.4    22:22   WBC (CBC)  16.3  RBC (CBC) 4.02  Hemoglobin (CBC) 13.4  Hematocrit (CBC) 38.4  Platelet Count (CBC) 217  MCV 96  MCH 33.2  MCHC 34.8  RDW 12.6  Neutrophil % 86.0  Lymphocyte % 4.5  Monocyte % 9.1  Eosinophil % 0.1  Basophil % 0.3  Neutrophil #  14.0  Lymphocyte #  0.7  Monocyte #  1.5  Eosinophil # 0.0  Basophil # 0.0 (Result(s) reported on 25 Sep 2012 at 10:40PM.)   XRay:    08-Mar-14 14:30, Chest PA and Lateral  Chest PA and Lateral   REASON FOR EXAM:    sob  COMMENTS:       PROCEDURE: DXR - DXR CHEST PA (OR AP) AND LATERAL  - Sep 25 2012  2:30PM     RESULT: There is no previous exam for comparison.    The interstitial markings are mildly prominent, likely fibrotic. The   heart isborderline enlarged. The lungs are hyperinflated consistent with   COPD. No pneumothorax is evident. The ribs appear to be intact on this   single projection.    IMPRESSION:   1. COPD. Fibrotic changes. No definite acute abnormality evident.  Dictation Site: 6        Verified By: Sundra Aland, M.D., MD    08-Mar-14 14:30, Elbow Left Complete  Elbow Left Complete  REASON FOR EXAM:    pain l elbow  COMMENTS:       PROCEDURE: DXR - DXR ELBOW LT COMP W/OBLIQUES  - Sep 25 2012  2:30PM     RESULT: Left elbow images demonstrate significant artifact from overlying   loose scan. One area concern is over the medial supracondylar region. A   definite joint effusion is not appreciated on the lateral view. A   definite fractures not appreciated but given the significant artifact   followup images would be recommended if the patient has persistent or   worsening symptoms.    IMPRESSION:  The study is limited by significant artifact as discussed   above. No definite fracture is appreciated on all images. Follow up   imaging may be necessary.  Dictation Site: 6        Verified By: Sundra Aland, M.D., MD    08-Mar-14 14:30, Hip Right Complete  Hip Right Complete   REASON FOR EXAM:    pain after fall  COMMENTS:       PROCEDURE: DXR - DXR HIP RIGHT COMPLETE  - Sep 25 2012  2:30PM     RESULT: Right hip images demonstrate a right femoral neck fracture with   superior migration of the shaft only slightly. The femoral head appears   intact. The acetabulum and pubic region appears unremarkable.    IMPRESSION:   1. Right femoral neck fracture.    Dictation Site: 6      Verified By: Sundra Aland, M.D., MD    08-Mar-14 14:30, Humerus Right  Humerus Right   REASON FOR EXAM:    r arm pain  COMMENTS:       PROCEDURE:  DXR - DXR HUMERUS RIGHT  - Sep 25 2012  2:30PM     RESULT: Right humerus images are somewhat underexposed. No gross bony   abnormality is evident but if there is concern for fracture in the   humeral head or elbow then dedicated images of the shoulder or elbow are   recommended.    IMPRESSION:  Please see above.    Dictation Site: 6      Verified By: Sundra Aland, M.D., MD    08-Mar-14 14:30, Pelvis AP Only  Pelvis AP Only   REASON FOR EXAM:    pain r hip  COMMENTS:       PROCEDURE: DXR - DXR PELVIS AP ONLY  - Sep 25 2012  2:30PM     RESULT: Degenerative changes are seen diffusely in the lumbar spine.   Orthopedic screws are present through the left femoral neck into the  femoral head. There is a fracture in the right femoral neck. No   significant displacement is seen on this single view.    IMPRESSION:  Please see above.    Dictation Site: 6      Verified By: Sundra Aland, M.D., MD  CT:    08-Mar-14 17:25, CT Head Without Contrast  CT Head Without Contrast   REASON FOR EXAM:    ha  COMMENTS:       PROCEDURE: CT  - CT HEAD WITHOUT CONTRAST  - Sep 25 2012  5:25PM     RESULT: Emergent noncontrast CT of the brain is performed. The patient   has no previous exam for comparison.    There is mild prominence of the ventricles and sulci consistent with   atrophy within normal limits for the patient's age. There  is no   intracranial hemorrhage, mass, mass effect or midline shift. No   territorial infarct is appreciated. There is mucosal thickening in the   left maxillary region with a mucous retention cyst inferiorly in the left   maxillary sinus. There is partial opacification of the right mastoid tip   and air cells. The sinuses and mastoids are otherwise clear. The     calvarium appears intact.    IMPRESSION:   1. Changes of atrophy. No acute intracranial abnormality. Left maxillary   sinus disease and partial opacification of the right  mastoid.    Dictation Site: 6        Verified By: Sundra Aland, M.D., MD    Impression Patient is an 81 yo who presents after being found down.  NOt able to get up.  Found to have a fractured hip.  Plan for surgery  Asked to risk stratify. I have spoken to the patients family as patient is not good historian  They say she is active, able to climb stairs multiple times per day  No CP or SOB with this. On exam there is no evidence of CHF.  EKG shows normal sinus rhythm. Labs signif for markedly elevated CK with MB suggesting it is skeletal in origin.  There is mild elevation of troponin that is now trending down. I am not convinced of active cardiac ischemia.  Trop may reflect some demand ischemia if patient hypotensive after fall.  It is not clear how long patient was laying on floor. Marland KitchenCK/MB do not reflect cardiac injury WIth cardiac findings otherwise normal I would recomm proceeding with planned surgery without further cardiac testing prior. Her risk for a major cardiac complication in periop period appear to be low and outweighed by risks of not repairing hip.  Follow BP, HR and fluid status closely.  Keep on telemetry.  EKG q AM  Cycle cardiac enzymes over tomorrow.  Discussed with niece Unable to reach sister.   Electronic Signatures: Dorris Carnes (MD)  (Signed 09-Mar-14 12:46)  Authored: General Aspect/Present Illness, Home Medications, Allergies, History and Physical Exam, Vital Signs, Labs, Radiology, Impression/Plan   Last Updated: 09-Mar-14 12:46 by Dorris Carnes (MD)

## 2014-11-10 NOTE — Discharge Summary (Signed)
PATIENT NAME:  Kaitlin Hodges, Kaitlin Hodges MR#:  [id number] DATE OF BIRTH:  March 08, 1926  DATE OF ADMISSION:  09/29/2012 DATE OF DISCHARGE:  09/30/2012  ADDENDUM  Initial day of discharge was 09/29/2012, but was held because of technical issues with her insurance until 09/30/2012. There has been no change in the patient's status, although she was ambulating better with physical therapy up to about 40 feet. The patient had stable labs, and her vitals were stable as well. There was no change in her medications.     ____________________________ Shela CommonsJ. Dedra Skeensodd Mundy, [place]PA jtm:dm D: 09/30/2012 08:21:00 ET T: 09/30/2012 08:28:05 ET JOB#: [id number]  cc: J. Dedra Skeensodd Mundy, [place], <Dictator> J TODD [facility] AdaMUNDY PA ELECTRONICALLY SIGNED 10/04/2012 7:47

## 2014-11-10 NOTE — Consult Note (Signed)
Brief Consult Note: Comments: 81 yo with PMHx of Dementia, seen in consultation for preop cardiac clearance for right hip fracture  1. Preop clearance  - patient with no hx of CAD, HTN, CHF, HLD - METs scrore = 4-10 (good functional capacity prior to fall) - intermediate  risk surgery (orthopedic), (1-5% for cardiac complications) - does not require any further cardiac testing - no need for perioperative beta blocker due to no significant clinical risk factor for cardiac disease - recommend to check troponin  - limit use of benzos in the post operative period - conservative use of narcotics given dementia in the elderly  2. Leukocytosis - most likely stress response - continue to monitor - no fever, no cough, UA with no signs of infections  3. Rhabdomyolysis - CK 1721 - secondary to fall and being immobile - will continue with fluids (NS@75  cc\hr) - monitor UOP  4. Dementia - mild to moderate - continue with supportive care.  5. Right hip fracture - plan for surgical repair - pain management per surgery recommendations - conservative use of benzos and narcotics  6. Hyperinflation and borderline enlarged heart on CXR - per sister no previous cardiac history, not on inhalers - no compliants of cough, sob, sputum production, she has a CXR finding suggestive of COPD but no clinical features of COPD. - may require positive pressure or nasal canula post operatively  spoke with Vernon [facility] ([phone]) updated her on plan of care  FULL CODE PMD - Dr.Eason  Time spent evaluating patient = 45 minutes Job#[id number].  Electronic Signatures: Stephanie AcreMungal, Vishal (MD)  (Signed 08-Mar-14 18:48)  Authored: Brief Consult Note   Last Updated: 08-Mar-14 18:48 by Stephanie AcreMungal, Vishal (MD)

## 2014-11-11 NOTE — Consult Note (Signed)
Chief Complaint:  Subjective/Chief Complaint Pt denies any concerns except "I just want to die because all my family members are dead".  4 loose BMs in 24hrs.  Denies abdominal pain, nausea or vomiting.   VITAL SIGNS/ANCILLARY NOTES: **Vital Signs.:   14-Jan-15 09:03  Vital Signs Type Routine  Temperature Temperature (F) 97.4  Celsius 36.3  Temperature Source oral  Pulse Pulse 85  Respirations Respirations 18  Systolic BP Systolic BP 135  Diastolic BP (mmHg) Diastolic BP (mmHg) 71  Mean BP 92  Pulse Ox % Pulse Ox % 93  Pulse Ox Activity Level  At rest  Oxygen Delivery Room Air/ 21 %   Brief Assessment:  GEN no acute distress, thin, Alert x1, disoriented   Cardiac Regular   Respiratory normal resp effort   Gastrointestinal Normal   Gastrointestinal details normal Soft  Nontender  Nondistended  Bowel sounds normal  No rebound tenderness  No gaurding   EXTR negative cyanosis/clubbing, negative edema   Additional Physical Exam Skin: warm, dry, no jaundice   Assessment/Plan:  Assessment/Plan:  Assessment Pancreatic mass:  Family desires no further work-up. C diff colitis:  Improving. Hypomagnesemia/Hypokalemia: per attending   Plan 1) Continue supportive measures 2) Continue flagyl/vanc for total of 14 days Will sign off, but please do not hesitate to call if you have any questions or concerns. Thanks for allowing us to participate in her care.   Electronic Signatures: Joselyn ArrowJones, Genella Bas L (NP)  (Signed 14-Jan-15 12:29)  Authored: Chief Complaint, VITAL SIGNS/ANCILLARY NOTES, Brief Assessment, Assessment/Plan   Last Updated: 14-Jan-15 12:29 by Joselyn ArrowJones, Tameca Jerez L (NP)

## 2014-11-11 NOTE — Consult Note (Signed)
Chief Complaint:  Subjective/Chief Complaint Pt denies any concerns except "I am just tired"!  Attempted to contact sister Kaitlin Hodges & LM for return call.  CA 19-9 normal.   VITAL SIGNS/ANCILLARY NOTES: **Vital Signs.:   13-Jan-15 05:43  Vital Signs Type Routine  Temperature Temperature (F) 98.1  Celsius 36.7  Temperature Source oral  Pulse Pulse 76  Respirations Respirations 18  Systolic BP Systolic BP 154  Diastolic BP (mmHg) Diastolic BP (mmHg) 69  Mean BP 97  Pulse Ox % Pulse Ox % 93  Pulse Ox Activity Level  At rest  Oxygen Delivery Room Air/ 21 %    10:03  Pulse Pulse 80  Pulse source if not from Vital Sign Device apical  Systolic BP Systolic BP 140  Diastolic BP (mmHg) Diastolic BP (mmHg) 70  Mean BP 93  BP Source  if not from Vital Sign Device manual   Brief Assessment:  GEN no acute distress, thin, Alert x1, disoriented   Cardiac Regular   Respiratory normal resp effort   Gastrointestinal Normal   Gastrointestinal details normal Soft  Nontender  Nondistended  Bowel sounds normal  No rebound tenderness  No gaurding   EXTR negative cyanosis/clubbing, negative edema   Additional Physical Exam Skin: warm, dry, no jaundice   Assessment/Plan:  Assessment/Plan:  Assessment Pancreatic mass:  2.9cm microcystic neoplasm- serous cystadenoma vs IPMN.  CA 19-9.  Left message for return call from sister to further discuss. C diff colitis:  Improving   Plan 1) Continue supportive measures 2) will discuss pancreatic lesion with family once they are available 3) Continue flagyl/vanc Please call if you have any questions or concerns   Electronic Signatures for Addendum Section:  Kaitlin Hodges, Kaitlin Hodges (NP) (Signed Addendum 13-Jan-15 15:18)  Spoke with sister, Kaitlin Hodges @[phone] .  She does not desire any further work-up of patient's pancreatic lesion.   Electronic Signatures: Kaitlin Hodges, Kaitlin Hodges (NP)  (Signed 13-Jan-15 14:07)  Authored: Chief Complaint, VITAL  SIGNS/ANCILLARY NOTES, Brief Assessment, Assessment/Plan   Last Updated: 13-Jan-15 15:18 by Kaitlin Hodges, Joliana Claflin Hodges (NP)

## 2014-11-11 NOTE — Consult Note (Signed)
Brief Consult Note: Diagnosis: Pancreatic lesion.   Patient was seen by consultant.   Comments: Ms. Kaitlin Hodges is a pleasant 81 y/o female with dementia, htn, hx MRSA cellulitis, & multiple falls who was admitted with c diff colitis & SIRS.  Incidental finding of 2.9cm microcystic neoplasm- serous cystadenoma vs IPMN.  Incidental cholelithiasis without cholecystitis & sigmoid diverticulosis.  CA 19-9 pending.  No family members available at this time to discuss.  Would agree with radiologist recommendations of FU CT pancreas in 1 year to look for stability.    Plan: 1) I would suggest changing to PO FLAGYL if able to tolerate for better c diff coverage 2) FU CT pancreas in 1 yr 3) FU CA 19-9.  Thanks for consult.  Please see full dictated note. #[id number].  Electronic Signatures: Joselyn ArrowJones, Ramsey Midgett L (NP)  (Signed 12-Jan-15 18:53)  Authored: Brief Consult Note   Last Updated: 12-Jan-15 18:53 by Joselyn ArrowJones, Alanya Vukelich L (NP)

## 2014-11-11 NOTE — Consult Note (Signed)
PATIENT NAME:  Kaitlin Hodges, Kaitlin Hodges MR#:  [id number] DATE OF BIRTH:  Aug 13, 1925  DATE OF CONSULTATION:  08/01/2013  REFERRING PHYSICIAN:  Gladstone Lighter, MD CONSULTING PHYSICIAN:  Lucilla Lame, MD/Okie Bogacz Evalina Field, NP  PRIMARY CARE PHYSICIAN: Dr. Rutha Bouchard.  REASON FOR CONSULTATION: Pancreatic mass.   HISTORY OF PRESENT ILLNESS: Kaitlin Hodges is a pleasantly demented 81 year old Caucasian female who lives in [place]. She was admitted to the hospital July 31, 2013, after having nausea, vomiting, and diarrhea that was reported  but at present for one day. She was unable to keep down fluids and she was sent to the ER. Upon admission, her stool was C. difficile positive. The patient is unable to provide a history due to her severe dementia, and there are no family members present today, so most of this information was obtained from the medical records. She has been started on IV Flagyl 500 mg q.8 hours. Her white blood cell count was 14.5, down to 6.3 today. She denies any pain today. Nurses report no nausea or vomiting.   Review of CT scan of abdomen and pelvis shows a 1.1 x 2.9 x 2.4 cm lesion at the head and uncinate process of the pancreas, microcystic neoplasm, serous cystadenoma versus IPMN. She  has cholelithiasis without evidence of cholecystitis and sigmoid diverticulosis. She is on iron 325 mg b.i.d., Imodium p.r.n., and docusate and Maalox p.r.n. at home.   PAST MEDICAL AND SURGICAL HISTORY: Dementia, hypertension, MRSA cellulitis, lower extremity edema, multiple falls, and a right hip fracture repair.   MEDICATIONS PRIOR TO ADMISSION: Docusate 10 mg/ mL liquid 10 mL b.i.d. with 6 ounces of juice, Eucerin topical cream to bilateral lower extremities b.i.d., ferrous sulfate 325 mg  b.i.d., Imodium q.3 hours p.r.n. loose stools, lorazepam 0.25 mg q.8 hours p.r.n. for agitation, Maalox 30 mL q.i.d. for indigestion/heartburn, Tylenol 500 mg q.4 hours for pain and fever, metoprolol 25 mg  p.o. b.i.d., milk of magnesia 30 mL at bedtime p.r.n. constipation, morphine rectal suppository every 8 hours, quetiapine 12.5 mg in the morning time, tramadol 50 mg p.r.n. pain, trazodone 50 mg at bedtime, vitamin D3 at 2000 international units daily.   ALLERGIES: No known drug allergies.   FAMILY HISTORY: Unable to obtain.   SOCIAL HISTORY: The patient resides at [facility]. There is no tobacco, alcohol, or illicit drug use.   REVIEW OF SYSTEMS: Unable to obtain from patient.   PHYSICAL EXAMINATION: VITAL SIGNS: Temperature 98.3, pulse 82, respirations 17, blood pressure 118/57, O2 sat 92% on room air.  GENERAL: She is an elderly Caucasian female who is alert, pleasant, cooperative, oriented x 1. She has a history of severe dementia.  HEENT: Sclerae clear, anicteric. Conjunctivae pink. Oropharynx pink and moist.  NECK: Supple without mass or thyromegaly.  HEART: regular rate and rhythm. Normal S1, S2, with a III/VI murmur noted.  LUNGS: With decreased breath sounds at bilateral bases. No acute distress.  ABDOMEN: Positive bowel sounds x 4. No bruits auscultated. Abdomen is soft, mildly distended, without hepatosplenomegaly or mass.  EXTREMITIES: Dry and scaling, with trace pretibial edema bilaterally.  SKIN: As above, without jaundice or cyanosis.  NEUROLOGIC: She appears to be at her baseline. Alert and oriented x 1. She is cooperative.   LABORATORY STUDIES: Potassium 3.4, chloride 109, calcium 8.4, otherwise normal met-7. Magnesium 1.6. C. difficile positive. Albumin 3.2. Alkaline phosphatase 136, AST 14, ALT 19, total protein 7.6, total bilirubin 0.5. Troponins negative. Hemoglobin 11.1, hematocrit 32.1, RBC 3.48, WBC 6.3, platelets  172. Urinalysis positive for 1+ blood, 2 RBCs, 1+ bacteria.   IMPRESSION: Kaitlin Hodges is a pleasant 81 year old female with advanced dementia, hypertension, history of methicillin-resistant Staphylococcus aureus cellulitis, and multiple falls who was  admitted with Clostridium difficile colitis and systemic inflammatory response syndrome. Incidental finding of a 2.9 cm microcystic neoplasm, serous cystadenoma versus intraductal papillary mucinous neoplasm. Incidental cholelithiasis without cholecystitis and sigmoid diverticulosis. CA 19-9 is pending. Today, there were no family members available during my consult to discuss her care further. Given the fact that she would be likely not be a surgical candidate, I would like to discuss with the family how aggressive they would like to be about her pancreatic lesion. At this point, I would agree with the radiologist's recommendations of a followup CT of the pancreas in 1 year to look for stability. Could consider further aggressive management if CA 19-9 is elevated and/or family wanted to be aggressive; however, ultimately I do not think it will change the outcome, as again, she is likely not a surgical candidate.   PLAN:  1.  I would suggest changing to p.o. Flagyl for her C. difficile, if she is able to tolerate, for better C. difficile coverage.  2.  Followup CT of the pancreas in 1 year or sooner pending discussion with family.  3.  Followup CA 19-9.   Thank you for allowing [place] to participate in the care of Ms. Ramer.   ____________________________ Andria Meuse, NP klj:jcm D: 08/01/2013 19:04:10 ET T: 08/01/2013 21:00:26 ET JOB#: [id number]  cc: Andria Meuse, NP, <Dictator> Dr. Rutha Bouchard Andria Meuse FNP ELECTRONICALLY SIGNED 08/03/2013 21:12

## 2014-11-11 NOTE — H&P (Signed)
PATIENT NAME:  Kaitlin Hodges, CYPHERS MR#:  [id number] DATE OF BIRTH:  1925/09/22  DATE OF ADMISSION:  07/31/2013  ADMITTING PHYSICIAN: Gladstone Lighter, M.D.   PRIMARY CARE PHYSICIAN: Dr. Rutha Bouchard.   CHIEF COMPLAINT: Sent in from [facility] for nausea, vomiting, diarrhea.   HISTORY OF PRESENT ILLNESS: Kaitlin Hodges is an 81 year old, pleasant, Caucasian female with a past medical history significant for severe dementia, hypertension and MRSA cellulitis of her legs in the past. Was sent in from [facility] secondary to nausea, vomiting and diarrhea for 1 day. Started with nausea and vomiting yesterday and diarrhea got worse to the point that she is having bowel movements every few hours. She is unable to keep anything down. Was sent over to the ER. Her stool for C. diff is positive. The patient is unable to provide any history due to her dementia.   PAST MEDICAL HISTORY:  1. Advanced dementia.  2. Multiple falls.  3. MRSA cellulitis in the past.  4. Bilateral lower extremity chronic skin changes from fluid retention.  5. Hypertension.   PAST SURGICAL HISTORY: Right hip fracture repair.   ALLERGIES TO MEDICATIONS: No known drug allergies.   CURRENT MEDICATIONS:  1. Docusate 10 mg/mL liquid - 10 mL twice a day with 6 ounces of juice.  2. Eucerin topical cream bilateral lower extremities twice a day.  3. Ferrous sulfate 325 mg p.o. b.i.d. with meals.  4. Imodium every 3 hours p.r.n. for loose stools.  5. Lorazepam 0.25 mg q.8 hours p.r.n. for agitation.  6. Maalox 30 mL 4 times a day for indigestion or heartburn.  7. Tylenol 500 mg p.o. q.4 hours p.r.n. for pain or fever.  8. Metoprolol 25 mg p.o. b.i.d.  9. Milk of magnesia 30 mL daily at bedtime p.r.n. for constipation.  10. Morphine rectal suppository every 4 hours.  11. Quetiapine 12.5 mg in the morning.  12. Quetiapine 50 mg at bedtime.  13. Tramadol 258 mg q.4 hours p.r.n. for pain.  14. Trazodone 50 mg at bedtime.  15.  Vitamin D3 2000 international units daily.   SOCIAL HISTORY: Resident of [facility] in the dementia care unit. No history of any smoking. Ambulates with a walker. Oriented to self at baseline.   FAMILY HISTORY: Not obtainable.   REVIEW OF SYSTEMS: Unable to be obtained secondary to dementia.   PHYSICAL EXAMINATION:  VITAL SIGNS: Temperature 98.3 degrees Fahrenheit, pulse 94, respirations 20, blood pressure 124/76, pulse ox 94% on room air. Also noted to be tachycardic with heart rate as high as 110s.  GENERAL: Well-built, well-nourished female lying in bed. Very emotional. Not in any acute distress.  HEENT: Normocephalic, atraumatic. Pupils equal, round, reacting to light. Anicteric sclerae. Extraocular movements intact. Oropharynx is clear without erythema, mass or exudates.  NECK: Supple. No thyromegaly, JVD or carotid bruits. No lymphadenopathy. Normal range of motion without any pain.  LUNGS: Moving air bilaterally. Decreased bibasilar breath sounds. No wheeze or crackles. No use of accessory muscles for breathing.  CARDIOVASCULAR: S1, S2, regular rate and rhythm. A 3/6 systolic murmur heard. No rubs or gallops.  ABDOMEN: Soft, nontender, nondistended. No hepatosplenomegaly. Normal bowel sounds.  EXTREMITIES: She does not have any pedal edema but does have chronic skin changes from previous edema. 1+ dorsalis pedis pulses palpable bilaterally. No clubbing or cyanosis.  SKIN: No acne, rash or lesions.  NEUROLOGIC: The patient not able to comply for a complete neurological examination. Her cranial nerves do not exhibit any  abnormalities. Face looks symmetric. Able to move her extremities, and sensation seems to be intact.  PSYCHOLOGICAL: Awake, alert and oriented to self. Knows her name and knows she was on January 1st. Unable to tell her age or year of birth which seems to be her baseline.   LABORATORY DATA: WBC 14.5, hemoglobin 13.7, hematocrit 40.0, platelet  count 204.   Sodium 140, potassium 4.2, chloride 109, bicarb 25, BUN 17, creatinine 0.84, glucose 152 and calcium of 9.2.   ALT 19, AST 14, alk phos 136, total bili 0.5. Albumin of 3.2. Lipase is 127. Troponin is negative. Urinalysis is 1+ bacteria, no WBCs. Stool for C. diff antigen and toxin is positive.   Abdominal x-ray showing nonspecific bowel gas pattern. Chest x-ray showing no evidence of acute cardiopulmonary disease.   CT of the abdomen and pelvis with contrast showing:  1. Microcystic neoplasm which could be serous cystadenoma versus intraductal papillary mucinous neoplasm involving head and uncinate process of the pancreas. If it is a microcystic neoplasm, no malignant potential.  2. Cholelithiasis without evidence of cholecystitis. Sigmoid colon diverticulosis noted. No abnormalities in the pelvis or abdomen.   EKG showing normal sinus rhythm, heart rate of 95.   ASSESSMENT AND PLAN: This is an 81 year old female with a history of advanced dementia, from [employer], hypertension, chronic skin changes in both lower extremities with history of methicillin-resistant Staphylococcus aureus cellulitis, presents with nausea, vomiting, diarrhea, noted to have Clostridium difficile positive here.  1. Systemic inflammatory response syndrome with sinus tachycardia and leukocytosis secondary to Clostridium difficile colitis. Will start on intravenous Flagyl, contact isolation, intravenous fluids and monitor. CT abdomen showing no acute findings other than a pancreatic mass.  2. Incidental finding on CT abdoemn of a Pancreatic mass, either could be due to cystadenoma versus neoplasm. Gastroenterology has been consulted, and CA-19-9 has been ordered. Not sure if she would be a treatable candidate if at all it is diagnosed to be cancer.   3. Hypertension: Continue metoprolol.  4. Severe dementia: Seems to be at baseline. Continue her home medications.  5. Anemia: Will hold her iron supplement  with her diarrhea at this time.  6. Deep vein prophylaxis with subcutaneous heparin.   CODE STATUS: FULL CODE.   TIME SPENT ON ADMISSION: 50 minutes.   ____________________________ Gladstone Lighter, MD rk:gb D: 07/31/2013 18:43:33 ET T: 07/31/2013 23:18:23 ET JOB#: [id number]  cc: Gladstone Lighter, MD, <Dictator> Gladstone Lighter MD ELECTRONICALLY SIGNED 08/02/2013 18:30

## 2014-11-11 NOTE — Discharge Summary (Signed)
PATIENT NAME:  Kaitlin Hodges, Kaitlin Hodges MR#:  [id number] DATE OF BIRTH:  Apr 15, 1926  DATE OF ADMISSION:  07/31/2013 DATE OF DISCHARGE:  08/04/2013.   ADMITTING DIAGNOSIS:  Systemic inflammatory response syndrome.   DISCHARGE DIAGNOSES:  1.  Systemic inflammatory response reaction due to Clostridium difficile enterocolitis.  2.  Clostridium difficile enterocolitis.  3.  Pancreatic mass was normal at CA-19-9, likely benign.  4.  A repeated CT scan of abdomen is recommended in one year.  5.  A history of hypertension.  6.  Dementia.  7.  Iron deficiency anemia, stable.   DISCHARGE CONDITION:  Stable.   DISCHARGE MEDICATIONS: The patient is to resume her outpatient medications, which are: 1.  Metoprolol tartrate 25 mg twice daily.  2.  Iron sulfate 325 mg twice daily.  3.  Seroquel 25 mg 2 tablets, which would be 50 mg p.o. in the evening.  4.  Vitamin D3 2000 units once daily.  5.  Maalox 2 teaspoonfuls, which will be 30 mL up to 4 times a day as needed.  6.  Tylenol 500 mg every 4 hours as needed.  7.  Milk of magnesia 8% oral suspension 30 mL once at bedtime as needed.  8.  Eucerin topical cream to bilateral lower legs twice daily.  9.   Seroquel 25 mg a 1/2 tablet, which would be 12.5 mg once in the morning.  10.  Metronidazole 500 mg 3 times daily for 14 more days.  11.  Vancomycin 250 mL in 5 mL oral solution, 2.5 mL every 6 hours for 14 more days.  12.  Tramadol 50 mg a 1/2 tablet every 4 hours as needed.  13.  Lorazepam 0.5 mg a 1/2 tablet every 8 hours as needed.  14.  Trazodone 50 mg once at bedtime.   HOME OXYGEN:  None.   DIET: 2-gram salt, mechanical soft.   ACTIVITY LIMITATIONS:  As tolerated.   FOLLOW UP:  Appointment with Dr. Celso SickleBhagalia in 2 to 3 days after discharge.   DISCHARGE DISPOSITION:  The patient is being referred to skilled nursing facility with Hospice.   CONSULTANTS:  Care Management, Social Work, Publishing rights managernurse practitioner, Aura Dialsandace Jones.   RADIOLOGIC STUDIES:  Chest,  portable, single view, 08/02/2013, revealed no evidence of acute cardiopulmonary disease.   ABDOMEN:  AP only x-ray, 07/31/2013, revealed nonspecific bowel gas pattern without radiographic findings to suggest small bowel obstruction and no free air was noted.   A CT of abdomen and pelvis with contrast, 07/31/2013, showed no acute abnormalities involving abdomen or pelvis. Microcystic neoplasm, which would be serous cystadenoma versus intraductal papillary mucinous neoplasm involving the head and uncinate process of the pancreas, maximum measurement of 2.9 cm, if a microcystic neoplasm, this has no malignant potential. Cholelithiasis without CT evidence of acute cholecystitis. Sigmoid colon diverticulosis without evidence of acute diverticulitis. Followup CT of abdomen in one year was recommended to follow the cystic pancreatic lesion.   HISTORY OF PRESENT ILLNESS:  The patient is an 81 year old Caucasian female with a history of hypertension, dementia, who presents to the hospital with complaints of nausea, vomiting, as well as diarrhea. Please refer to Dr. Prudencio PairKalisetti's admission note on 07/31/2013. On arrival to the hospital, the patient's pulse was 94, respiration rate was 20, blood pressure 124/76, O2 sats were 94% on room air. The patient was noted to be somewhat intermittently tachycardic with heart rate as high as 110. Physical exam was unremarkable.  LABORATORY, DIAGNOSTIC, AND RADIOLOGICAL DATA:  The patient's labs done  on the day of admission, 07/31/2013, revealed an elevation of glucose to 152, otherwise BMP was unremarkable. Lipase level was 127. Liver enzymes revealed mild elevation of alkaline phosphatase to 136 and albumin level of 3.2. The patient's troponin was less than 0.02. White blood cell count was elevated to 14.5, hemoglobin was 13.7, platelet count 204. Absolute neutrophil count was high at 13.5. The patient's urinalysis was remarkable for 1+ bacteria, 2 red blood cells, less than 1  white blood cell, 1 epithelial cell as well as mucus was present. C. diff. PCR in stool was positive. The patient's EKG was unremarkable.   HOSPITAL COURSE:  The patient was admitted to the hospital for further evaluation. She was started on Flagyl orally; however, her condition did not improve significantly. She still had significant diarrhea. She was seen by GI who felt that the vancomycin should be added due to poor C. diff. colitis control. The patient had addition of oral vancomycin on 08/02/2013. Her condition somewhat improved by the day of discharge. On the day of discharge, she is able to eat approximately 50% of offered meals. She also has small to medium amount of watery diarrhea. She was felt to be stable to be discharged to skilled nursing facility for management of her chronic medical problems. 1.  For her C. difficile colitis enterocolitis, she is to continue double antibiotic therapy for 14 more days upon discharge. As mentioned above, she underwent a CT scan of her lower abdomen, which showed changes in her pancreas. It was felt that the patient's CT scan should be repeated in the next one year. She had CA-19-9 antigen checked, which was found to be normal at 10. 2.  In regards to her hypertension, the patient is to continue her outpatient medications.  3.  For iron deficiency anemia, she is to continue iron supplementation.  4.  For a history of dementia, she is being referred to Hospice in the facility. She is being discharged in stable condition with the above-mentioned medications and followup.   Her vital signs on the day of discharge:  Temperature was 98.1, pulse was 60, respiration rate was 18, blood pressure 135/72. Saturation was 93% on room air at rest and 91% on exertion.   TIME SPENT:  40 minutes on this patient.   ____________________________ Kaitlin Caper, MD rv:jm D: 08/04/2013 15:42:28 ET T: 08/04/2013 16:32:07 ET JOB#: [id number]  cc: Kaitlin Caper, MD,  <Dictator> Madelon Lips, MD Jahmeer Porche Winona Legato MD ELECTRONICALLY SIGNED 08/12/2013 14:01

## 2016-05-05 DIAGNOSIS — Z515 Encounter for palliative care: Secondary | ICD-10-CM | POA: Diagnosis not present

## 2016-05-05 DIAGNOSIS — F0390 Unspecified dementia without behavioral disturbance: Secondary | ICD-10-CM | POA: Diagnosis not present

## 2016-05-05 DIAGNOSIS — R630 Anorexia: Secondary | ICD-10-CM | POA: Diagnosis not present

## 2016-05-05 DIAGNOSIS — Z66 Do not resuscitate: Secondary | ICD-10-CM | POA: Diagnosis not present

## 2017-11-20 ENCOUNTER — Other Ambulatory Visit: Payer: Self-pay

## 2017-11-20 ENCOUNTER — Emergency Department
Admission: EM | Admit: 2017-11-20 | Discharge: 2017-11-20 | Disposition: A | Discharge status: Home / Self Care | Payer: Medicare HMO | Attending: Emergency Medicine | Admitting: Emergency Medicine

## 2017-11-20 ENCOUNTER — Emergency Department: Payer: Medicare HMO

## 2017-11-20 DIAGNOSIS — S80212A Abrasion, left knee, initial encounter: Secondary | ICD-10-CM | POA: Insufficient documentation

## 2017-11-20 DIAGNOSIS — F0390 Unspecified dementia without behavioral disturbance: Secondary | ICD-10-CM | POA: Diagnosis not present

## 2017-11-20 DIAGNOSIS — Y92122 Bedroom in nursing home as the place of occurrence of the external cause: Secondary | ICD-10-CM | POA: Insufficient documentation

## 2017-11-20 DIAGNOSIS — Y999 Unspecified external cause status: Secondary | ICD-10-CM | POA: Insufficient documentation

## 2017-11-20 DIAGNOSIS — Y93E8 Activity, other personal hygiene: Secondary | ICD-10-CM | POA: Diagnosis not present

## 2017-11-20 DIAGNOSIS — S50312A Abrasion of left elbow, initial encounter: Secondary | ICD-10-CM | POA: Diagnosis not present

## 2017-11-20 DIAGNOSIS — S60512A Abrasion of left hand, initial encounter: Secondary | ICD-10-CM | POA: Diagnosis not present

## 2017-11-20 DIAGNOSIS — W06XXXA Fall from bed, initial encounter: Secondary | ICD-10-CM | POA: Insufficient documentation

## 2017-11-20 DIAGNOSIS — S0181XA Laceration without foreign body of other part of head, initial encounter: Secondary | ICD-10-CM | POA: Diagnosis not present

## 2017-11-20 DIAGNOSIS — S0993XA Unspecified injury of face, initial encounter: Secondary | ICD-10-CM | POA: Diagnosis present

## 2017-11-20 MED ORDER — LIDOCAINE-EPINEPHRINE 1 %-1:100000 IJ SOLN
INTRAMUSCULAR | Status: AC
  Administered 2017-11-20: 5 mL via INTRADERMAL
  Filled 2017-11-20: qty 1

## 2017-11-20 NOTE — ED Notes (Signed)
Pt resting w/o complaint, eyes closed.

## 2017-11-20 NOTE — ED Notes (Signed)
Transport called.

## 2017-11-20 NOTE — ED Provider Notes (Signed)
[facility] Emergency Department Provider Note ____________________________________________   First MD Initiated Contact with Patient 11/20/17 1340     (approximate)  I have reviewed the triage vital signs and the nursing notes.   HISTORY  Chief Complaint Fall  Level 5 caveat: History of present illness limited due to dementia  HPI Kaitlin Hodges is a 84 y.o. female with history of dementia who presents with head injury, acute onset this afternoon when the patient was sitting on edge of her bed brushing her hair and fell over forwards, not associated with LOC.  Patient also has abrasions to the left knee and left elbow.   No past medical history on file.  There are no active problems to display for this patient.     Prior to Admission medications   Not on File    Allergies Patient has no known allergies.  No family history on file.  Social History Social History   Tobacco Use  . Smoking status: Not on file  Substance Use Topics  . Alcohol use: Not on file  . Drug use: Not on file    Review of Systems Level 5 caveat: Unable to obtain review of systems due to dementia   ____________________________________________   PHYSICAL EXAM:  VITAL SIGNS: ED Triage Vitals  Enc Vitals Group     BP 11/20/17 1344 128/63     Pulse Rate 11/20/17 1344 85     Resp 11/20/17 1344 20     Temp 11/20/17 1344 98.3 F (36.8 C)     Temp Source 11/20/17 1344 Oral     SpO2 11/20/17 1344 97 %     Weight 11/20/17 1346 100 lb (45.4 kg)     Height --      Head Circumference --      Peak Flow --      Pain Score --      Pain Loc --      Pain Edu? --      Excl. in GC? --     Constitutional: Alert, smiling and moaning when touched.  Following commands. Eyes: Conjunctivae are normal.  EOMI.  PERRLA. Head: Approximately 3 cm round laceration to left forehead with macerated tissue. Nose: No congestion/rhinnorhea. Mouth/Throat: Mucous membranes are  moist.   Neck: Normal range of motion.  Cervical spine nontender. Cardiovascular: Normal rate, regular rhythm. Grossly normal heart sounds.  Good peripheral circulation. Respiratory: Normal respiratory effort.  No retractions. Lungs CTAB. Gastrointestinal: Soft and nontender. No distention.  Genitourinary: No flank tenderness. Musculoskeletal: Scattered abrasions to left elbow, left hand, and left knee.  FROM at all joints with no deformity or bony tenderness. Neurologic: Motor intact in all extremities. Skin:  Skin is warm and dry. No rash noted. Psychiatric: Unable to assess due to dementia.  ____________________________________________   LABS (all labs ordered are listed, but only abnormal results are displayed)  Labs Reviewed - No data to display ____________________________________________  EKG   ____________________________________________  RADIOLOGY  CT head: No ICH or fracture CT C-spine: No acute fracture  ____________________________________________   PROCEDURES  Procedure(s) performed: No  ..Laceration Repair Date/Time: 11/20/2017 4:15 PM Performed by: Dionne Bucy, MD Authorized by: Dionne Bucy, MD   Anesthesia (see MAR for exact dosages):    Anesthesia method:  Local infiltration   Local anesthetic:  Lidocaine 1% WITH epi Laceration details:    Location:  Face   Face location:  Forehead   Length (cm):  3 Repair type:  Repair type:  Simple Exploration:    Contaminated: no   Treatment:    Area cleansed with:  Betadine and saline   Amount of cleaning:  Extensive Skin repair:    Repair method:  Sutures   Suture size:  5-0   Suture material:  Nylon   Suture technique:  Simple interrupted   Number of sutures:  7 Approximation:    Approximation:  Close Post-procedure details:    Dressing:  Sterile dressing   Patient tolerance of procedure:  Tolerated well, no immediate complications    Critical Care performed:  No ____________________________________________   INITIAL IMPRESSION / ASSESSMENT AND PLAN / ED COURSE  Pertinent labs & imaging results that were available during my care of the patient were reviewed by me and considered in my medical decision making (see chart for details).  84 year old female with history of dementia presents from her nursing facility after a fall from a sitting position on her bed.  There was no LOC.  Per EMS, facility staff stated patient was acting at her baseline mental status.  On exam, she has a laceration to her left forehead with some tissue in the middle of it which is macerated and may not fully be able to be approximated.  Her neuro exam is nonfocal.  She has abrasions to her left hand, left elbow, and left knee, but no bony tenderness or deformity, and full range of motion at these joints.    Plan: CT head and C-spine and laceration repair.  There is no evidence of fracture or indication for imaging of the extremities.    ----------------------------------------- 4:16 PM on 11/20/2017 -----------------------------------------  CTs show no ICH or cervical spine fracture.   Laceration repaired successfully.  Patient is stable for discharge home.    ____________________________________________   FINAL CLINICAL IMPRESSION(S) / ED DIAGNOSES  Final diagnoses:  Laceration of forehead, initial encounter      NEW MEDICATIONS STARTED DURING THIS VISIT:  New Prescriptions   No medications on file     Note:  This document was prepared using Dragon voice recognition software and may include unintentional dictation errors.    Dionne Bucy, MD 11/20/17 [phone]

## 2017-11-20 NOTE — ED Triage Notes (Signed)
Pt to ED via EMS from [facility] c/o fall.  EMS states patient was sitting on edge of bed brushing her hair when she fell over.  Laceration that is covered to left forehead, abrasion to left knee and left elbow.  Older healing injuries noted to right side of body.  EMS vitals 125 CBG, 98% RA, 80 HR, 121/58 BP.  Hx of Alzheimer's, dementia, anxiety.  EMS states per facility patient is acting at baseline moaning/wailing, alert and oriented to self and place.

## 2017-11-20 NOTE — Discharge Instructions (Addendum)
Kaitlin Hodges should have her sutures removed in 1 week.  Her CT scans showed no intracranial bleeding or cervical spine fracture.   She should return to the ER for new or worsening bleeding, pain, change in mental status, or any other new or worsening symptoms that concern facility staff.

## 2017-11-20 NOTE — ED Notes (Signed)
Lidocaine and suture cart placed at the bedside per MD request.

## 2017-11-20 NOTE — ED Notes (Signed)
MD in room to assess patient.  Will continue to monitor.   

## 2018-01-18 DEATH — deceased
